# Patient Record
Sex: Female | Born: 1985 | Race: White | Hispanic: No | Marital: Single | Smoking: Current every day smoker
Health system: Southern US, Community
[De-identification: ages and names within clinical notes are randomized; demographics above are authoritative.]

## PROBLEM LIST (undated history)

## (undated) DIAGNOSIS — F32A Depression, unspecified: Secondary | ICD-10-CM

## (undated) DIAGNOSIS — E039 Hypothyroidism, unspecified: Secondary | ICD-10-CM

## (undated) DIAGNOSIS — Q211 Atrial septal defect, unspecified: Secondary | ICD-10-CM

## (undated) DIAGNOSIS — I82409 Acute embolism and thrombosis of unspecified deep veins of unspecified lower extremity: Secondary | ICD-10-CM

## (undated) DIAGNOSIS — Z22322 Carrier or suspected carrier of Methicillin resistant Staphylococcus aureus: Secondary | ICD-10-CM

## (undated) DIAGNOSIS — F909 Attention-deficit hyperactivity disorder, unspecified type: Secondary | ICD-10-CM

## (undated) DIAGNOSIS — J852 Abscess of lung without pneumonia: Secondary | ICD-10-CM

## (undated) DIAGNOSIS — F329 Major depressive disorder, single episode, unspecified: Secondary | ICD-10-CM

## (undated) HISTORY — PX: NASAL SINUS SURGERY: SHX719

---
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
## 2005-04-18 ENCOUNTER — Emergency Department (HOSPITAL_COMMUNITY): Admission: EM | Admit: 2005-04-18 | Discharge: 2005-04-19 | Payer: Self-pay | Admitting: Emergency Medicine

## 2006-07-06 ENCOUNTER — Emergency Department (HOSPITAL_COMMUNITY): Admission: EM | Admit: 2006-07-06 | Discharge: 2006-07-07 | Payer: Self-pay | Admitting: Emergency Medicine

## 2006-07-10 ENCOUNTER — Emergency Department (HOSPITAL_COMMUNITY): Admission: EM | Admit: 2006-07-10 | Discharge: 2006-07-10 | Payer: Self-pay | Admitting: Emergency Medicine

## 2006-10-08 ENCOUNTER — Emergency Department (HOSPITAL_COMMUNITY): Admission: EM | Admit: 2006-10-08 | Discharge: 2006-10-08 | Payer: Self-pay | Admitting: Emergency Medicine

## 2008-01-01 ENCOUNTER — Emergency Department (HOSPITAL_COMMUNITY): Admission: EM | Admit: 2008-01-01 | Discharge: 2008-01-01 | Payer: Self-pay | Admitting: Family Medicine

## 2009-07-27 ENCOUNTER — Emergency Department (HOSPITAL_COMMUNITY): Admission: EM | Admit: 2009-07-27 | Discharge: 2009-07-27 | Payer: Self-pay | Admitting: Emergency Medicine

## 2009-08-07 ENCOUNTER — Inpatient Hospital Stay (HOSPITAL_COMMUNITY): Admission: EM | Admit: 2009-08-07 | Discharge: 2009-08-11 | Payer: Self-pay | Admitting: Emergency Medicine

## 2009-08-07 ENCOUNTER — Ambulatory Visit: Payer: Self-pay | Admitting: Cardiology

## 2009-08-07 ENCOUNTER — Ambulatory Visit: Payer: Self-pay | Admitting: Infectious Diseases

## 2009-08-07 ENCOUNTER — Ambulatory Visit: Payer: Self-pay | Admitting: Pulmonary Disease

## 2009-08-09 ENCOUNTER — Encounter (INDEPENDENT_AMBULATORY_CARE_PROVIDER_SITE_OTHER): Payer: Self-pay | Admitting: Internal Medicine

## 2009-08-11 ENCOUNTER — Ambulatory Visit: Payer: Self-pay | Admitting: Internal Medicine

## 2009-08-11 ENCOUNTER — Encounter: Payer: Self-pay | Admitting: Internal Medicine

## 2009-08-17 ENCOUNTER — Ambulatory Visit: Payer: Self-pay | Admitting: Vascular Surgery

## 2009-08-17 ENCOUNTER — Inpatient Hospital Stay (HOSPITAL_COMMUNITY): Admission: EM | Admit: 2009-08-17 | Discharge: 2009-08-19 | Payer: Self-pay | Admitting: Emergency Medicine

## 2009-08-20 ENCOUNTER — Ambulatory Visit: Payer: Self-pay | Admitting: Internal Medicine

## 2009-08-22 ENCOUNTER — Ambulatory Visit: Payer: Self-pay | Admitting: Internal Medicine

## 2009-08-25 ENCOUNTER — Ambulatory Visit: Payer: Self-pay | Admitting: Internal Medicine

## 2009-08-29 ENCOUNTER — Ambulatory Visit: Payer: Self-pay | Admitting: Internal Medicine

## 2009-09-05 ENCOUNTER — Ambulatory Visit: Payer: Self-pay | Admitting: Internal Medicine

## 2009-09-05 LAB — CONVERTED CEMR LAB
Basophils Absolute: 0 10*3/uL (ref 0.0–0.1)
Eosinophils Absolute: 0.1 10*3/uL (ref 0.0–0.7)
Eosinophils Relative: 2 % (ref 0–5)
HCT: 37.6 % (ref 36.0–46.0)
Hemoglobin: 12.1 g/dL (ref 12.0–15.0)
Iron: 73 ug/dL (ref 42–145)
MCHC: 32.2 g/dL (ref 30.0–36.0)
MCV: 91 fL (ref 78.0–100.0)
Monocytes Absolute: 0.5 10*3/uL (ref 0.1–1.0)
Platelets: 269 10*3/uL (ref 150–400)
RDW: 14.4 % (ref 11.5–15.5)
Retic Ct Pct: 1.8 % (ref 0.4–3.1)

## 2009-09-17 ENCOUNTER — Ambulatory Visit (HOSPITAL_COMMUNITY): Admission: RE | Admit: 2009-09-17 | Discharge: 2009-09-17 | Payer: Self-pay | Admitting: Internal Medicine

## 2009-09-18 ENCOUNTER — Encounter (INDEPENDENT_AMBULATORY_CARE_PROVIDER_SITE_OTHER): Payer: Self-pay | Admitting: Internal Medicine

## 2009-09-25 ENCOUNTER — Ambulatory Visit: Payer: Self-pay | Admitting: Pulmonary Disease

## 2009-09-25 DIAGNOSIS — J852 Abscess of lung without pneumonia: Secondary | ICD-10-CM | POA: Insufficient documentation

## 2009-09-25 DIAGNOSIS — Z9189 Other specified personal risk factors, not elsewhere classified: Secondary | ICD-10-CM | POA: Insufficient documentation

## 2009-09-25 DIAGNOSIS — F329 Major depressive disorder, single episode, unspecified: Secondary | ICD-10-CM

## 2009-10-16 ENCOUNTER — Encounter (INDEPENDENT_AMBULATORY_CARE_PROVIDER_SITE_OTHER): Payer: Self-pay | Admitting: Internal Medicine

## 2009-10-20 ENCOUNTER — Emergency Department (HOSPITAL_COMMUNITY): Admission: EM | Admit: 2009-10-20 | Discharge: 2009-10-20 | Payer: Self-pay | Admitting: Emergency Medicine

## 2009-10-29 ENCOUNTER — Encounter (INDEPENDENT_AMBULATORY_CARE_PROVIDER_SITE_OTHER): Payer: Self-pay | Admitting: Internal Medicine

## 2009-10-29 ENCOUNTER — Ambulatory Visit: Payer: Self-pay | Admitting: Internal Medicine

## 2009-10-29 LAB — CONVERTED CEMR LAB: INR: 2.28 — ABNORMAL HIGH (ref <1.50)

## 2009-11-12 ENCOUNTER — Emergency Department (HOSPITAL_COMMUNITY)
Admission: EM | Admit: 2009-11-12 | Discharge: 2009-11-12 | Payer: Self-pay | Source: Home / Self Care | Admitting: Family Medicine

## 2009-11-14 ENCOUNTER — Inpatient Hospital Stay (HOSPITAL_COMMUNITY)
Admission: EM | Admit: 2009-11-14 | Discharge: 2009-11-17 | Payer: Self-pay | Source: Home / Self Care | Admitting: Emergency Medicine

## 2009-11-14 ENCOUNTER — Encounter (INDEPENDENT_AMBULATORY_CARE_PROVIDER_SITE_OTHER): Payer: Self-pay | Admitting: Internal Medicine

## 2009-11-16 ENCOUNTER — Encounter (INDEPENDENT_AMBULATORY_CARE_PROVIDER_SITE_OTHER): Payer: Self-pay | Admitting: Internal Medicine

## 2009-11-16 ENCOUNTER — Ambulatory Visit: Payer: Self-pay | Admitting: Vascular Surgery

## 2009-11-16 ENCOUNTER — Other Ambulatory Visit: Payer: Self-pay | Admitting: Otolaryngology

## 2009-11-17 ENCOUNTER — Other Ambulatory Visit: Payer: Self-pay | Admitting: Otolaryngology

## 2009-11-17 ENCOUNTER — Other Ambulatory Visit: Payer: Self-pay | Admitting: Internal Medicine

## 2009-11-17 ENCOUNTER — Encounter: Payer: Self-pay | Admitting: Pulmonary Disease

## 2009-11-18 ENCOUNTER — Ambulatory Visit: Payer: Self-pay | Admitting: Cardiovascular Disease

## 2009-11-18 ENCOUNTER — Inpatient Hospital Stay (HOSPITAL_COMMUNITY)
Admission: EM | Admit: 2009-11-18 | Discharge: 2009-11-21 | Payer: Self-pay | Source: Home / Self Care | Admitting: Emergency Medicine

## 2009-11-21 ENCOUNTER — Encounter (INDEPENDENT_AMBULATORY_CARE_PROVIDER_SITE_OTHER): Payer: Self-pay | Admitting: Internal Medicine

## 2009-11-24 ENCOUNTER — Telehealth (INDEPENDENT_AMBULATORY_CARE_PROVIDER_SITE_OTHER): Payer: Self-pay | Admitting: *Deleted

## 2009-11-24 ENCOUNTER — Ambulatory Visit: Payer: Self-pay | Admitting: Pulmonary Disease

## 2009-11-24 DIAGNOSIS — I80299 Phlebitis and thrombophlebitis of other deep vessels of unspecified lower extremity: Secondary | ICD-10-CM

## 2009-11-25 ENCOUNTER — Encounter: Payer: Self-pay | Admitting: Critical Care Medicine

## 2009-11-25 DIAGNOSIS — E039 Hypothyroidism, unspecified: Secondary | ICD-10-CM | POA: Insufficient documentation

## 2009-12-16 ENCOUNTER — Encounter (INDEPENDENT_AMBULATORY_CARE_PROVIDER_SITE_OTHER): Payer: Self-pay | Admitting: *Deleted

## 2009-12-16 LAB — CONVERTED CEMR LAB
T3 Uptake Ratio: 33.3 % (ref 22.5–37.0)
T4, Total: 8.5 ug/dL (ref 5.0–12.5)
TSH: 0.636 microintl units/mL (ref 0.350–4.500)

## 2009-12-19 ENCOUNTER — Encounter (INDEPENDENT_AMBULATORY_CARE_PROVIDER_SITE_OTHER): Payer: Self-pay | Admitting: *Deleted

## 2009-12-19 LAB — CONVERTED CEMR LAB
Alkaline Phosphatase: 34 units/L — ABNORMAL LOW (ref 39–117)
Basophils Relative: 1 % (ref 0–1)
CO2: 25 meq/L (ref 19–32)
Creatinine, Ser: 0.86 mg/dL (ref 0.40–1.20)
Eosinophils Absolute: 0.1 10*3/uL (ref 0.0–0.7)
Eosinophils Relative: 2 % (ref 0–5)
Glucose, Bld: 86 mg/dL (ref 70–99)
HCT: 38.5 % (ref 36.0–46.0)
Lymphs Abs: 2.1 10*3/uL (ref 0.7–4.0)
MCHC: 32.7 g/dL (ref 30.0–36.0)
MCV: 91.9 fL (ref 78.0–100.0)
Monocytes Absolute: 0.5 10*3/uL (ref 0.1–1.0)
Monocytes Relative: 8 % (ref 3–12)
Neutrophils Relative %: 60 % (ref 43–77)
RBC: 4.19 M/uL (ref 3.87–5.11)
Sodium: 140 meq/L (ref 135–145)
Total Bilirubin: 0.6 mg/dL (ref 0.3–1.2)
Total Protein: 7.9 g/dL (ref 6.0–8.3)
WBC: 6.8 10*3/uL (ref 4.0–10.5)

## 2010-02-03 NOTE — Miscellaneous (Signed)
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

## 2010-02-03 NOTE — Assessment & Plan Note (Signed)
Summary: 1 MONTH/ MBW   Visit Type:  Follow-up Primary Ha Shannahan/Referring Georgiann Neider:  n/a  CC:  1 month follow up. Pt c/o weight gain, tearful during pt interview, and states Tonya Figueroa is "tired" of being sick.  History of Present Illness: 25/F, recent adm aug 2011 for RUL lung abscess. 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 8/14 duplex showed lt posterior tibial DVT - put on coumadin.  FU CT chest 8/14 showed significant improvement in abscess cavity,rt hilar Lns,  took augmentin x 28 days. Tonya Figueroa denies cough, fevers. Echo - no vegetation, atrial septal aneurysm.  Tonya Figueroa reports hair fall & tiredness , on coumadin.  Tonya Figueroa reports chronic stiffness in both hands & occasional pain & swelling - wonders about carpal tunnel syndrome.  November 24, 2009 11:31 AM  Adm 11/11- 11/13 for nasal MRSA cellulitis , seen by ENT Then again 11/15-18 for weight gain due o 'fluid overload", BNP 200 - 30 range, old Tonya Figueroa had 'heart failure', echo shows nml LV fn & PFO,on lasix,  TSH 6.1 - started on synthroid 25 micrograms Gained 11 lbs since last visit, tearful due to all the diagnoses terms that have been thrown at her.   Preventive Screening-Counseling & Management  Alcohol-Tobacco     Alcohol drinks/day: 0     Smoking Status: quit     Packs/Day: 0.5     Year Started: 2001     Year Quit: 2011  Current Medications (verified): 1)  Wellbutrin Sr 200 Mg Xr12h-Tab (Bupropion Hcl) .... Take 1 Tablet By Mouth Two Times A Day 2)  Adderall 30 Mg Tabs (Amphetamine-Dextroamphetamine) .... Take 1 Tablet By Mouth Two Times A Day 3)  Multivitamins   Tabs (Multiple Vitamin) .... Take 1 Tablet By Mouth Once A Day 4)  Vitamin C 500 Mg Chew (Ascorbic Acid) .... Take 1 Tablet By Mouth Once A Day 5)  Avelox 400 Mg Tabs (Moxifloxacin Hcl) .... Take 1 Tablet By Mouth Once A Day 6)   Levothyroxine Sodium 25 Mcg Tabs (Levothyroxine Sodium) .... Take 1 Tablet By Mouth Once A Day 7)  Zofran 4 Mg Tabs (Ondansetron Hcl) .... Take 1 Tablet By Mouth Every 6 Hours As Needed 8)  Percocet 5-325 Mg Tabs (Oxycodone-Acetaminophen) .... Take 1 Tablet By Mouth Every 6 Hours As Needed 9)  Senokot 8.6 Mg Tabs (Sennosides) .... Take 1 Tablet By Mouth Two Times A Day As Needed 10)  Doxycycline Hyclate 100 Mg Caps (Doxycycline Hyclate) .... 800mg  Two Times A Day 11)  Lasix 40 Mg Tabs (Furosemide) .... Take 1 Tablet By Mouth Once A Day 12)  Bactroban Nasal 2 % Oint (Mupirocin Calcium) .Marland Kitchen.. 1 Application Right Nasal Three Times A Day  Allergies (verified): 1)  ! Robitussin Maximum Strength (Dextromethorphan Hbr)  Past History:  Past Medical History: Last updated: 09/25/2009 Depression ADHD  Social History: Last updated: 09/25/2009 Marital Status: single Children: no Occupation: Catering manager  Patient states former smoker.  Former Psychologist, counselling user  Review of Systems       The patient complains of dyspnea on exertion.  The patient denies anorexia, fever, weight loss, weight gain, vision loss, decreased hearing, hoarseness, chest pain, syncope, peripheral edema, prolonged cough, headaches, hemoptysis, abdominal pain, melena, hematochezia, severe indigestion/heartburn, hematuria, muscle weakness, suspicious skin lesions, transient blindness, difficulty walking, depression, unusual weight change,  abnormal bleeding, and enlarged lymph nodes.    Vital Signs:  Patient profile:   25 year old female Height:      65 inches Weight:      164.6 pounds BMI:     27.49 O2 Sat:      100 % on Room air Temp:     98.1 degrees F oral Pulse rate:   105 / minute BP sitting:   126 / 80  (right arm) Cuff size:   regular  Vitals Entered By: Zackery Barefoot CMA (November 24, 2009 11:18 AM)  O2 Flow:  Room air CC: 1 month follow up. Pt c/o weight gain, tearful during pt interview, states Tonya Figueroa is  "tired" of being sick Comments Medications reviewed with patient Verified contact number and pharmacy with patient Zackery Barefoot CMA  November 24, 2009 11:19 AM    Physical Exam  Additional Exam:  Gen. Pleasant, well-nourished, in no distress, normal affect ENT - no lesions, no post nasal drip Neck: No JVD, no thyromegaly, no carotid bruits Lungs: no use of accessory muscles, no dullness to percussion, clear without rales or rhonchi  Cardiovascular: Rhythm regular, heart sounds  normal, no murmurs or gallops, no peripheral edema Abdomen: soft and non-tender, no hepatosplenomegaly, BS normal. Musculoskeletal: No deformities, no cyanosis or clubbing      Impression & Recommendations:  Problem # 1:  D V T (ICD-451.19)  resolved on FU duplex dc coumadin The following medications were removed from the medication list:    Coumadin 2 Mg Tabs (Warfarin sodium) .Marland Kitchen... Take 3 tabs by mouth at bedtime  Orders: Est. Patient Level IV (16109)  Problem # 2:  LUNG ABSCESS (ICD-513)  resolved  Orders: Est. Patient Level IV (60454)  Problem # 3:  HYPOTHYROIDISM, BORDERLINE (ICD-244.9)  TSH marginally high T3, T4 not checked. Will have her FU with primary care to further address. Her updated medication list for this problem includes:    Levothyroxine Sodium 25 Mcg Tabs (Levothyroxine sodium) .Marland Kitchen... Take 1 tablet by mouth once a day  Orders: Est. Patient Level IV (09811)  Medications Added to Medication List This Visit: 1)  Avelox 400 Mg Tabs (Moxifloxacin hcl) .... Take 1 tablet by mouth once a day 2)  Levothyroxine Sodium 25 Mcg Tabs (Levothyroxine sodium) .... Take 1 tablet by mouth once a day 3)  Zofran 4 Mg Tabs (Ondansetron hcl) .... Take 1 tablet by mouth every 6 hours as needed 4)  Percocet 5-325 Mg Tabs (Oxycodone-acetaminophen) .... Take 1 tablet by mouth every 6 hours as needed 5)  Senokot 8.6 Mg Tabs (Sennosides) .... Take 1 tablet by mouth two times a day as  needed 6)  Doxycycline Hyclate 100 Mg Caps (Doxycycline hyclate) .... 800mg  two times a day 7)  Lasix 40 Mg Tabs (Furosemide) .... Take 1 tablet by mouth once a day 8)  Bactroban Nasal 2 % Oint (Mupirocin calcium) .Marland Kitchen.. 1 application right nasal three times a day  Patient Instructions: 1)  Copy sent to: Dr Reche Dixon 2)  Please schedule a follow-up appointment as needed. 3)  Stay on thyroid medication 4)  You have patent foramen ovale (PFO ) -opening in your heart 5)  We will find you another medical Dr if Dr Reche Dixon unable to see you   Immunization History:  Influenza Immunization History:    Influenza:  historical (11/20/2009)  Pneumovax Immunization History:    Pneumovax:  historical (11/20/2009)

## 2010-02-03 NOTE — Assessment & Plan Note (Signed)
Summary: POST HOSP/CB   Visit Type:  Hospital Follow-up Primary Provider/Referring Provider:  n/a  CC:  Pt here for hospital follow up. Tonya Figueroa  History of Present Illness: 25/F, recent adm aug 2011 for RUL lung abscess. 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 8/14 duplex showed lt posterior tibial DVT - put on coumadin.  FU CT chest 8/14 showed significant improvement in abscess cavity,rt hilar Lns,  took augmentin x 28 days. She denies cough, fevers. Echo - no vegetation, atrial septal aneurysm.  She reports hair fall & tiredness , on coumadin.  She reports chronic stiffness in both hands & occasional pain & swelling - wonders about carpal tunnel syndrome.   Preventive Screening-Counseling & Management  Alcohol-Tobacco     Alcohol drinks/day: 0     Smoking Status: quit     Packs/Day: 0.5     Year Started: 2001     Year Quit: 2011  Current Medications (verified): 1)  Wellbutrin Sr 200 Mg Xr12h-Tab (Bupropion Hcl) .... Take 1 Tablet By Mouth Two Times A Day 2)  Adderall 30 Mg Tabs (Amphetamine-Dextroamphetamine) .... Take 1 Tablet By Mouth Two Times A Day 3)  Coumadin 2 Mg Tabs (Warfarin Sodium) .... Take 3 Tabs By Mouth At Bedtime 4)  Multivitamins   Tabs (Multiple Vitamin) .... Take 1 Tablet By Mouth Once A Day 5)  Vitamin C 500 Mg Chew (Ascorbic Acid) .... Take 1 Tablet By Mouth Once A Day 6)  Vitamin D .... Take 1 Capsule By Mouth Once A Day  Allergies (verified): 1)  ! Robitussin Maximum Strength (Dextromethorphan Hbr)  Past History:  Past Medical History: Depression ADHD  Past Surgical History: WISDOM TEETH EXTRACTION, HX OF (ICD-V15.9)    Family History: Family History Diabetes-mother Heart problems-mother side Depression-mother  Social History: Marital Status: single Children: no Occupation: Geophysical data processor  Patient states former smoker.  Former Chief Technology Officer Smoking Status:  quit Packs/Day:  0.5 Alcohol drinks/day:  0  Review of Systems  The patient denies shortness of breath with activity, shortness of breath at rest, productive cough, non-productive cough, coughing up blood, chest pain, irregular heartbeats, acid heartburn, indigestion, loss of appetite, weight change, abdominal pain, difficulty swallowing, sore throat, tooth/dental problems, headaches, nasal congestion/difficulty breathing through nose, sneezing, itching, ear ache, anxiety, depression, hand/feet swelling, joint stiffness or pain, rash, change in color of mucus, and fever.    Vital Signs:  Patient profile:   25 year old female Height:      65 inches Weight:      153.50 pounds BMI:     25.64 O2 Sat:      98 % on Room air Temp:     98.1 degrees F oral Pulse rate:   68 / minute BP sitting:   108 / 70  (right arm) Cuff size:   regular  Vitals Entered By: Zackery Barefoot CMA (September 25, 2009 9:40 AM)  O2 Flow:  Room air CC: Pt here for hospital follow up.  Comments Medications reviewed with patient Verified contact number and pharmacy with patient Zackery Barefoot CMA  September 25, 2009 9:40 AM    Physical Exam  Additional Exam:  Gen. Pleasant, well-nourished, in no distress, normal affect ENT - no lesions, no post nasal drip Neck: No JVD, no thyromegaly, no carotid bruits Lungs: no  use of accessory muscles, no dullness to percussion, clear without rales or rhonchi  Cardiovascular: Rhythm regular, heart sounds  normal, no murmurs or gallops, no peripheral edema Abdomen: soft and non-tender, no hepatosplenomegaly, BS normal. Musculoskeletal: No deformities, no cyanosis or clubbing Neuro:  alert, non focal     Impression & Recommendations:  Problem # 1:  LUNG ABSCESS (ICD-513) Almost resolved on CXR 9/14 rpt CXR in 2 months Rt hilar LNS noted , likley infectious - doubt rpt CT required given such  improvement Orders: Est. Patient Level IV (16109) Doppler Referral (Doppler)  Problem # 2:  PULMONARY EMBOLISM (ICD-415.1) Hopefully, 3 months of coumadin would be adequate rpt doppler in 3 mnths Rheum opinion for carpal tunnel/ small joint arthritis Her updated medication list for this problem includes:    Coumadin 2 Mg Tabs (Warfarin sodium) .Tonya Figueroa... Take 3 tabs by mouth at bedtime  Orders: Est. Patient Level IV (60454) Rheumatology Referral (Rheumatology) Doppler Referral (Doppler)  Medications Added to Medication List This Visit: 1)  Wellbutrin Sr 200 Mg Xr12h-tab (Bupropion hcl) .... Take 1 tablet by mouth two times a day 2)  Adderall 30 Mg Tabs (Amphetamine-dextroamphetamine) .... Take 1 tablet by mouth two times a day 3)  Coumadin 2 Mg Tabs (Warfarin sodium) .... Take 3 tabs by mouth at bedtime 4)  Multivitamins Tabs (Multiple vitamin) .... Take 1 tablet by mouth once a day 5)  Vitamin C 500 Mg Chew (Ascorbic acid) .... Take 1 tablet by mouth once a day 6)  Vitamin D  .... Take 1 capsule by mouth once a day  Patient Instructions: 1)  Please schedule a follow-up appointment in 2 months - mid Nov 2)  Repeat doppler study 3)  Copy sent to: [employer]

## 2010-02-03 NOTE — Progress Notes (Signed)
Summary: FAX REQ/ NOTE FOR WORK  Phone Note Call from Patient Call back at [facility] Phone [phone]   Caller: Patient Call For: ALVA Summary of Call: PT WAS SEEN TODAY. NEEDS A NOTE FAXED TO WORK STATING THAT SHE IS CLEARED TO RETURN TO WORK TODAY. FAX ASAP TO THE ATTN OF AMY: [phone] Initial call taken by: Tivis Ringer, CNA,  November 24, 2009 3:03 PM  Follow-up for Phone Call        pls advise if ok for pt to return to work?  Philipp Deputy [facility]  November 24, 2009 4:13 PM   OK. Let her know that I have placed request to get her new PCP at [facility] Follow-up by: Comer Locket. Vassie Loll MD,  November 25, 2009 9:01 AM  Additional Follow-up for Phone Call Additional follow up Details #1::        Letter faxed to Amy's attn at fax number provided above.  Pt aware of this and aware RA has placed request for pt a new PCP at [facility].  She verbalized understanding of this.   Gweneth Dimitri RN  November 25, 2009 9:28 AM

## 2010-02-03 NOTE — Letter (Signed)
Summary: [employer]  [address]   [place], [place] [address]   Phone: [phone]  Fax: [phone]    Today's Date: November 24, 2009  Name of Patient: Tonya Figueroa  The above named patient had a medical visit today.    Please take this into consideration when reviewing the time away from work    Special Instructions:  [ x ] None  [  ] To be off the remainder of today, returning to the normal work / school schedule tomorrow.  [  ] To be off until the next scheduled appointment on ______________________.  [ x ] Other _ may return to work today __________________________________ ________________________________________________________________   Sincerely yours,   Dr. Cyril Mourning

## 2010-02-09 ENCOUNTER — Emergency Department (HOSPITAL_COMMUNITY)
Admission: EM | Admit: 2010-02-09 | Discharge: 2010-02-09 | Disposition: A | Discharge status: Home / Self Care | Payer: Self-pay | Attending: Emergency Medicine | Admitting: Emergency Medicine

## 2010-02-09 ENCOUNTER — Emergency Department (HOSPITAL_COMMUNITY): Payer: Self-pay

## 2010-02-09 DIAGNOSIS — F329 Major depressive disorder, single episode, unspecified: Secondary | ICD-10-CM | POA: Insufficient documentation

## 2010-02-09 DIAGNOSIS — R635 Abnormal weight gain: Secondary | ICD-10-CM | POA: Insufficient documentation

## 2010-02-09 DIAGNOSIS — F411 Generalized anxiety disorder: Secondary | ICD-10-CM | POA: Insufficient documentation

## 2010-02-09 DIAGNOSIS — R5381 Other malaise: Secondary | ICD-10-CM | POA: Insufficient documentation

## 2010-02-09 DIAGNOSIS — F3289 Other specified depressive episodes: Secondary | ICD-10-CM | POA: Insufficient documentation

## 2010-02-09 DIAGNOSIS — E039 Hypothyroidism, unspecified: Secondary | ICD-10-CM | POA: Insufficient documentation

## 2010-02-09 DIAGNOSIS — R0602 Shortness of breath: Secondary | ICD-10-CM | POA: Insufficient documentation

## 2010-02-09 DIAGNOSIS — R002 Palpitations: Secondary | ICD-10-CM | POA: Insufficient documentation

## 2010-02-09 LAB — POCT I-STAT, CHEM 8
BUN: 20 mg/dL (ref 6–23)
Calcium, Ion: 1.2 mmol/L (ref 1.12–1.32)
Creatinine, Ser: 1.1 mg/dL (ref 0.4–1.2)
Glucose, Bld: 89 mg/dL (ref 70–99)
Hemoglobin: 14.3 g/dL (ref 12.0–15.0)
Sodium: 137 mEq/L (ref 135–145)
TCO2: 24 mmol/L (ref 0–100)

## 2010-02-10 LAB — TSH: TSH: 1.47 u[IU]/mL (ref 0.350–4.500)

## 2010-02-19 NOTE — Op Note (Signed)
  Tonya Figueroa, Tonya Figueroa NO.:  [id number]  MEDICAL RECORD NO.:  [id number]          PATIENT TYPE:  INP  LOCATION:  5158                         FACILITY:  [facility]  PHYSICIAN:  Suzanna Obey, M.D.       DATE OF BIRTH:  Feb 27, 1985  DATE OF PROCEDURE:  11/16/2009 DATE OF DISCHARGE:                              OPERATIVE REPORT   PREOPERATIVE DIAGNOSIS:  Right nasal abscess.  POSTOPERATIVE DIAGNOSIS:  Right nasal abscess.  SURGICAL PROCEDURE:  Incision and drainage of right nasal vestibule abscess.  ANESTHESIA:  General.  ESTIMATED BLOOD LOSS:  Less than 5 mL.  INDICATIONS:  This is a 25 year old with a previous history of MRSA on her gluteus region and previous lung abscess and now has a swelling of her right face.  She was admitted, placed on intravenous antibiotics and has settled down, but MRI and CT scan done on consecutive days showed persistence of a fluid-filled right nasal area about 1 cm in size, and she persists with significant discomfort and some swelling.  It has improved on antibiotics, but she wanted to proceed with the incision and drainage.  She was informed of the risks and benefits of the procedure and options were discussed.  All questions were answered and consent was obtained.  OPERATION:  The patient was taken to the operating room and placed in supine position after LMA general anesthesia, she was placed in a supine position, prepped and draped in the usual sterile manner.  The nose was examined.  There was a crusted area, overlying the intercartilaginous region in the vestibule superiorly and this was opened up with a hemostat and a forceps.  This opened the abscess cavity and cultures were taken.  There was white purulent material within the cavity.  A hemostat was used to open up this cavity widely, and the pus was evacuated with the Culturette as well as the saline irrigation.  Adaptic wick was placed into the wound, and there was no  further areas of concern of abscess cavity formation.  She was then awakened and brought to recovery in stable condition.  Counts correct.          ______________________________ Suzanna Obey, M.D.     JB/MEDQ  D:  11/16/2009  T:  11/17/2009  Job:  [id number]  Electronically Signed by Suzanna Obey M.D. on 02/19/2010 09:54:07 AM

## 2010-03-17 LAB — TSH: TSH: 6.105 u[IU]/mL — ABNORMAL HIGH (ref 0.350–4.500)

## 2010-03-17 LAB — BASIC METABOLIC PANEL
BUN: 12 mg/dL (ref 6–23)
BUN: 18 mg/dL (ref 6–23)
BUN: 8 mg/dL (ref 6–23)
Calcium: 9.3 mg/dL (ref 8.4–10.5)
Calcium: 9.4 mg/dL (ref 8.4–10.5)
Chloride: 94 mEq/L — ABNORMAL LOW (ref 96–112)
Creatinine, Ser: 0.76 mg/dL (ref 0.4–1.2)
GFR calc Af Amer: >60 mL/min (ref >60)
GFR calc non Af Amer: >60 mL/min (ref >60)
GFR calc non Af Amer: >60 mL/min (ref >60)
GFR calc non Af Amer: >60 mL/min (ref >60)
Glucose, Bld: 91 mg/dL (ref 70–99)
Glucose, Bld: 96 mg/dL (ref 70–99)
Potassium: 3.5 mEq/L (ref 3.5–5.1)
Sodium: 133 mEq/L — ABNORMAL LOW (ref 135–145)
Sodium: 134 mEq/L — ABNORMAL LOW (ref 135–145)
Sodium: 139 mEq/L (ref 135–145)

## 2010-03-17 LAB — PROTIME-INR
INR: 1.37 (ref 0.00–1.49)
INR: 2.11 — ABNORMAL HIGH (ref 0.00–1.49)
INR: 1.05 (ref 0.00–1.49)
Prothrombin Time: 17.1 seconds — ABNORMAL HIGH (ref 11.6–15.2)
Prothrombin Time: 13.9 seconds (ref 11.6–15.2)

## 2010-03-17 LAB — COMPREHENSIVE METABOLIC PANEL
ALT: 14 U/L (ref 0–35)
ALT: 26 U/L (ref 0–35)
Alkaline Phosphatase: 34 U/L — ABNORMAL LOW (ref 39–117)
BUN: 6 mg/dL (ref 6–23)
BUN: 6 mg/dL (ref 6–23)
CO2: 26 mEq/L (ref 19–32)
Calcium: 9.1 mg/dL (ref 8.4–10.5)
Chloride: 107 mEq/L (ref 96–112)
GFR calc Af Amer: >60 mL/min (ref >60)
GFR calc non Af Amer: >60 mL/min (ref >60)
GFR calc non Af Amer: >60 mL/min (ref >60)
Glucose, Bld: 91 mg/dL (ref 70–99)
Sodium: 140 mEq/L (ref 135–145)
Total Bilirubin: 0.3 mg/dL (ref 0.3–1.2)
Total Protein: 7.2 g/dL (ref 6.0–8.3)

## 2010-03-17 LAB — ANAEROBIC CULTURE

## 2010-03-17 LAB — DIFFERENTIAL
Basophils Absolute: 0 10*3/uL (ref 0.0–0.1)
Basophils Relative: 1 % (ref 0–1)
Basophils Relative: 0 % (ref 0–1)
Eosinophils Absolute: 0 10*3/uL (ref 0.0–0.7)
Eosinophils Absolute: 0.2 10*3/uL (ref 0.0–0.7)
Lymphs Abs: 2 10*3/uL (ref 0.7–4.0)
Monocytes Relative: 8 % (ref 3–12)
Neutro Abs: 8.3 10*3/uL — ABNORMAL HIGH (ref 1.7–7.7)
Neutrophils Relative %: 73 % (ref 43–77)
Neutrophils Relative %: 65 % (ref 43–77)

## 2010-03-17 LAB — URINALYSIS, ROUTINE W REFLEX MICROSCOPIC
Bilirubin Urine: NEGATIVE
Ketones, ur: NEGATIVE mg/dL
Nitrite: NEGATIVE
pH: 6 (ref 5.0–8.0)

## 2010-03-17 LAB — CBC
HCT: 31.2 % — ABNORMAL LOW (ref 36.0–46.0)
HCT: 35.2 % — ABNORMAL LOW (ref 36.0–46.0)
HCT: 37.5 % (ref 36.0–46.0)
Hemoglobin: 10.6 g/dL — ABNORMAL LOW (ref 12.0–15.0)
Hemoglobin: 12.1 g/dL (ref 12.0–15.0)
Hemoglobin: 11.7 g/dL — ABNORMAL LOW (ref 12.0–15.0)
MCH: 30.5 pg (ref 26.0–34.0)
MCH: 30.8 pg (ref 26.0–34.0)
MCH: 29.8 pg (ref 26.0–34.0)
MCHC: 33.9 g/dL (ref 30.0–36.0)
MCHC: 34.4 g/dL (ref 30.0–36.0)
MCHC: 31.8 g/dL (ref 30.0–36.0)
MCV: 89.4 fL (ref 78.0–100.0)
MCV: 91.2 fL (ref 78.0–100.0)
Platelets: 186 10*3/uL (ref 150–400)
RBC: 3.15 MIL/uL — ABNORMAL LOW (ref 3.87–5.11)
RBC: 4.11 MIL/uL (ref 3.87–5.11)
RDW: 13.9 % (ref 11.5–15.5)
RDW: 14.2 % (ref 11.5–15.5)
WBC: 9.2 10*3/uL (ref 4.0–10.5)

## 2010-03-17 LAB — CULTURE, ROUTINE-ABSCESS

## 2010-03-17 LAB — TYPE AND SCREEN: Antibody Screen: NEGATIVE

## 2010-03-17 LAB — URINE MICROSCOPIC-ADD ON

## 2010-03-17 LAB — POCT PREGNANCY, URINE: Preg Test, Ur: NEGATIVE

## 2010-03-17 LAB — TROPONIN I: Troponin I: 0.04 ng/mL (ref 0.00–0.06)

## 2010-03-17 LAB — CULTURE, BLOOD (ROUTINE X 2)
Culture  Setup Time: 201111111802
Culture: NO GROWTH

## 2010-03-17 LAB — LIPASE, BLOOD: Lipase: 15 U/L (ref 11–59)

## 2010-03-17 LAB — ABO/RH: ABO/RH(D): A POS

## 2010-03-18 LAB — POCT URINALYSIS DIPSTICK
Bilirubin Urine: NEGATIVE
Glucose, UA: NEGATIVE mg/dL
Nitrite: POSITIVE — AB
Protein, ur: NEGATIVE mg/dL
Specific Gravity, Urine: >=1.03 (ref 1.005–1.030)
Urobilinogen, UA: 0.2 mg/dL (ref 0.0–1.0)
pH: 5.5 (ref 5.0–8.0)

## 2010-03-18 LAB — CULTURE, ROUTINE-ABSCESS

## 2010-03-20 LAB — DIFFERENTIAL
Basophils Absolute: 0 10*3/uL (ref 0.0–0.1)
Basophils Absolute: 0 10*3/uL (ref 0.0–0.1)
Basophils Absolute: 0.1 10*3/uL (ref 0.0–0.1)
Basophils Relative: 0 % (ref 0–1)
Basophils Relative: 1 % (ref 0–1)
Eosinophils Absolute: 0 10*3/uL (ref 0.0–0.7)
Eosinophils Absolute: 0.2 10*3/uL (ref 0.0–0.7)
Eosinophils Relative: 0 % (ref 0–5)
Eosinophils Relative: 0 % (ref 0–5)
Eosinophils Relative: 1 % (ref 0–5)
Lymphocytes Relative: 6 % — ABNORMAL LOW (ref 12–46)
Lymphs Abs: 1.1 10*3/uL (ref 0.7–4.0)
Lymphs Abs: 1.7 10*3/uL (ref 0.7–4.0)
Monocytes Absolute: 0.7 10*3/uL (ref 0.1–1.0)
Monocytes Absolute: 1.3 10*3/uL — ABNORMAL HIGH (ref 0.1–1.0)
Monocytes Relative: 5 % (ref 3–12)
Monocytes Relative: 7 % (ref 3–12)
Neutro Abs: 15.2 10*3/uL — ABNORMAL HIGH (ref 1.7–7.7)

## 2010-03-20 LAB — URINALYSIS, ROUTINE W REFLEX MICROSCOPIC
Glucose, UA: NEGATIVE mg/dL
Hgb urine dipstick: NEGATIVE
Ketones, ur: NEGATIVE mg/dL
Ketones, ur: NEGATIVE mg/dL
Nitrite: NEGATIVE
Protein, ur: NEGATIVE mg/dL
Protein, ur: NEGATIVE mg/dL
Urobilinogen, UA: 0.2 mg/dL (ref 0.0–1.0)

## 2010-03-20 LAB — CULTURE, RESPIRATORY W GRAM STAIN

## 2010-03-20 LAB — CULTURE, BLOOD (ROUTINE X 2)
Culture: NO GROWTH
Culture: NO GROWTH

## 2010-03-20 LAB — CBC
HCT: 35.2 % — ABNORMAL LOW (ref 36.0–46.0)
Hemoglobin: 10.9 g/dL — ABNORMAL LOW (ref 12.0–15.0)
Hemoglobin: 12.2 g/dL (ref 12.0–15.0)
MCH: 31.5 pg (ref 26.0–34.0)
MCH: 31.6 pg (ref 26.0–34.0)
MCH: 31.6 pg (ref 26.0–34.0)
MCH: 31.8 pg (ref 26.0–34.0)
MCHC: 34.5 g/dL (ref 30.0–36.0)
MCHC: 34.7 g/dL (ref 30.0–36.0)
MCHC: 34.8 g/dL (ref 30.0–36.0)
MCV: 89.8 fL (ref 78.0–100.0)
MCV: 91.1 fL (ref 78.0–100.0)
Platelets: 310 10*3/uL (ref 150–400)
Platelets: 337 10*3/uL (ref 150–400)
Platelets: 357 10*3/uL (ref 150–400)
Platelets: 412 10*3/uL — ABNORMAL HIGH (ref 150–400)
RBC: 3.79 MIL/uL — ABNORMAL LOW (ref 3.87–5.11)
RDW: 12.9 % (ref 11.5–15.5)
RDW: 13.2 % (ref 11.5–15.5)

## 2010-03-20 LAB — POCT I-STAT, CHEM 8
Calcium, Ion: 1.17 mmol/L (ref 1.12–1.32)
Creatinine, Ser: 0.7 mg/dL (ref 0.4–1.2)
Glucose, Bld: 56 mg/dL — ABNORMAL LOW (ref 70–99)
HCT: 40 % (ref 36.0–46.0)
Hemoglobin: 13.6 g/dL (ref 12.0–15.0)
TCO2: 29 mmol/L (ref 0–100)

## 2010-03-20 LAB — URINE CULTURE
Culture: NO GROWTH
Special Requests: NEGATIVE

## 2010-03-20 LAB — EXPECTORATED SPUTUM ASSESSMENT W GRAM STAIN, RFLX TO RESP C

## 2010-03-20 LAB — LUPUS ANTICOAGULANT PANEL
DRVVT: 61.1 secs — ABNORMAL HIGH (ref 36.2–44.3)
Lupus Anticoagulant: NOT DETECTED
dRVVT Incubated 1:1 Mix: 44.3 secs (ref 36.2–44.3)

## 2010-03-20 LAB — HEPATITIS B DNA, ULTRAQUANTITATIVE, PCR

## 2010-03-20 LAB — CARDIOLIPIN ANTIBODIES, IGG, IGM, IGA
Anticardiolipin IgA: 6 APL U/mL — ABNORMAL LOW (ref <22)
Anticardiolipin IgG: 14 GPL U/mL (ref <23)
Anticardiolipin IgM: 6 MPL U/mL — ABNORMAL LOW (ref <11)

## 2010-03-20 LAB — BASIC METABOLIC PANEL
BUN: 11 mg/dL (ref 6–23)
BUN: 4 mg/dL — ABNORMAL LOW (ref 6–23)
CO2: 26 mEq/L (ref 19–32)
CO2: 28 mEq/L (ref 19–32)
Calcium: 8.5 mg/dL (ref 8.4–10.5)
Chloride: 102 mEq/L (ref 96–112)
Creatinine, Ser: 1.1 mg/dL (ref 0.4–1.2)
GFR calc Af Amer: >60 mL/min (ref >60)
GFR calc non Af Amer: >60 mL/min (ref >60)
Glucose, Bld: 121 mg/dL — ABNORMAL HIGH (ref 70–99)
Potassium: 4.5 mEq/L (ref 3.5–5.1)
Sodium: 138 mEq/L (ref 135–145)

## 2010-03-20 LAB — POCT CARDIAC MARKERS

## 2010-03-20 LAB — HOMOCYSTEINE: Homocysteine: 9.9 umol/L (ref 4.0–15.4)

## 2010-03-20 LAB — BETA-2-GLYCOPROTEIN I ABS, IGG/M/A: Beta-2 Glyco I IgG: 1 G Units (ref <20)

## 2010-03-20 LAB — VANCOMYCIN, TROUGH: Vancomycin Tr: 32.5 ug/mL (ref 10.0–20.0)

## 2010-03-20 LAB — PROTIME-INR
Prothrombin Time: 14.1 seconds (ref 11.6–15.2)
Prothrombin Time: 14.8 seconds (ref 11.6–15.2)

## 2010-03-20 LAB — ANTITHROMBIN III: AntiThromb III Func: 134 % — ABNORMAL HIGH (ref 76–126)

## 2010-03-20 LAB — HEPATITIS B SURFACE ANTIGEN: Hepatitis B Surface Ag: NEGATIVE

## 2010-03-20 LAB — HEPATITIS B SURFACE ANTIBODY,QUALITATIVE: Hep B S Ab: POSITIVE — AB

## 2010-03-20 LAB — AFB CULTURE WITH SMEAR (NOT AT ARMC)

## 2010-03-20 LAB — HEPATITIS B CORE ANTIBODY, TOTAL: Hep B Core Total Ab: POSITIVE — AB

## 2010-03-20 LAB — POCT PREGNANCY, URINE: Preg Test, Ur: NEGATIVE

## 2010-04-02 ENCOUNTER — Emergency Department (HOSPITAL_COMMUNITY): Payer: Self-pay

## 2010-04-02 ENCOUNTER — Emergency Department (HOSPITAL_COMMUNITY)
Admission: EM | Admit: 2010-04-02 | Discharge: 2010-04-02 | Disposition: A | Discharge status: Home / Self Care | Payer: Self-pay | Attending: Emergency Medicine | Admitting: Emergency Medicine

## 2010-04-02 DIAGNOSIS — O99891 Other specified diseases and conditions complicating pregnancy: Secondary | ICD-10-CM | POA: Insufficient documentation

## 2010-04-02 DIAGNOSIS — S239XXA Sprain of unspecified parts of thorax, initial encounter: Secondary | ICD-10-CM | POA: Insufficient documentation

## 2010-04-02 DIAGNOSIS — F3289 Other specified depressive episodes: Secondary | ICD-10-CM | POA: Insufficient documentation

## 2010-04-02 DIAGNOSIS — F909 Attention-deficit hyperactivity disorder, unspecified type: Secondary | ICD-10-CM | POA: Insufficient documentation

## 2010-04-02 DIAGNOSIS — F329 Major depressive disorder, single episode, unspecified: Secondary | ICD-10-CM | POA: Insufficient documentation

## 2010-04-02 DIAGNOSIS — M549 Dorsalgia, unspecified: Secondary | ICD-10-CM | POA: Insufficient documentation

## 2010-04-02 DIAGNOSIS — X58XXXA Exposure to other specified factors, initial encounter: Secondary | ICD-10-CM | POA: Insufficient documentation

## 2010-04-02 DIAGNOSIS — E039 Hypothyroidism, unspecified: Secondary | ICD-10-CM | POA: Insufficient documentation

## 2010-04-02 LAB — URINALYSIS, ROUTINE W REFLEX MICROSCOPIC
Bilirubin Urine: NEGATIVE
Glucose, UA: NEGATIVE mg/dL
Hgb urine dipstick: NEGATIVE
Ketones, ur: NEGATIVE mg/dL
Protein, ur: NEGATIVE mg/dL
pH: 6 (ref 5.0–8.0)

## 2010-04-02 LAB — POCT PREGNANCY, URINE: Preg Test, Ur: POSITIVE

## 2010-04-20 ENCOUNTER — Emergency Department (HOSPITAL_COMMUNITY): Payer: Self-pay

## 2010-04-20 ENCOUNTER — Emergency Department (HOSPITAL_COMMUNITY)
Admission: EM | Admit: 2010-04-20 | Discharge: 2010-04-21 | Disposition: A | Discharge status: Home / Self Care | Payer: Self-pay | Attending: Emergency Medicine | Admitting: Emergency Medicine

## 2010-04-20 DIAGNOSIS — E039 Hypothyroidism, unspecified: Secondary | ICD-10-CM | POA: Insufficient documentation

## 2010-04-20 DIAGNOSIS — O209 Hemorrhage in early pregnancy, unspecified: Secondary | ICD-10-CM | POA: Insufficient documentation

## 2010-04-20 LAB — CBC
HCT: 36.3 % (ref 36.0–46.0)
Hemoglobin: 12.1 g/dL (ref 12.0–15.0)
MCHC: 33.3 g/dL (ref 30.0–36.0)
MCV: 88.1 fL (ref 78.0–100.0)

## 2010-04-20 LAB — URINALYSIS, ROUTINE W REFLEX MICROSCOPIC
Glucose, UA: NEGATIVE mg/dL
Ketones, ur: NEGATIVE mg/dL
Leukocytes, UA: NEGATIVE
pH: 6.5 (ref 5.0–8.0)

## 2010-04-20 LAB — DIFFERENTIAL
Basophils Absolute: 0 10*3/uL (ref 0.0–0.1)
Lymphocytes Relative: 33 % (ref 12–46)
Lymphs Abs: 3.2 10*3/uL (ref 0.7–4.0)
Monocytes Absolute: 0.7 10*3/uL (ref 0.1–1.0)
Monocytes Relative: 7 % (ref 3–12)
Neutro Abs: 5.5 10*3/uL (ref 1.7–7.7)

## 2010-04-20 LAB — COMPREHENSIVE METABOLIC PANEL
AST: 22 U/L (ref 0–37)
Albumin: 3.8 g/dL (ref 3.5–5.2)
Calcium: 9.3 mg/dL (ref 8.4–10.5)
Creatinine, Ser: 0.58 mg/dL (ref 0.4–1.2)
GFR calc Af Amer: >60 mL/min (ref >60)

## 2010-04-20 LAB — WET PREP, GENITAL
Clue Cells Wet Prep HPF POC: NONE SEEN
Trich, Wet Prep: NONE SEEN

## 2010-04-20 LAB — URINE MICROSCOPIC-ADD ON

## 2010-04-21 ENCOUNTER — Other Ambulatory Visit (HOSPITAL_COMMUNITY): Payer: Self-pay | Admitting: Obstetrics

## 2010-04-21 DIAGNOSIS — O3680X0 Pregnancy with inconclusive fetal viability, not applicable or unspecified: Secondary | ICD-10-CM

## 2010-04-21 LAB — GC/CHLAMYDIA PROBE AMP, GENITAL
Chlamydia, DNA Probe: NEGATIVE
GC Probe Amp, Genital: NEGATIVE

## 2010-04-24 ENCOUNTER — Inpatient Hospital Stay (HOSPITAL_COMMUNITY): Payer: Self-pay

## 2010-04-24 ENCOUNTER — Other Ambulatory Visit: Payer: Self-pay | Admitting: Obstetrics

## 2010-04-24 ENCOUNTER — Inpatient Hospital Stay (HOSPITAL_COMMUNITY)
Admission: AD | Admit: 2010-04-24 | Discharge: 2010-04-24 | Disposition: A | Discharge status: Home / Self Care | Payer: Self-pay | Source: Ambulatory Visit | Attending: Obstetrics | Admitting: Obstetrics

## 2010-04-24 DIAGNOSIS — O039 Complete or unspecified spontaneous abortion without complication: Secondary | ICD-10-CM | POA: Insufficient documentation

## 2010-04-24 LAB — CBC
HCT: 38.8 % (ref 36.0–46.0)
MCV: 90 fL (ref 78.0–100.0)
Platelets: 238 10*3/uL (ref 150–400)
RBC: 4.31 MIL/uL (ref 3.87–5.11)
WBC: 9.7 10*3/uL (ref 4.0–10.5)

## 2010-04-27 ENCOUNTER — Other Ambulatory Visit (HOSPITAL_COMMUNITY): Payer: Self-pay | Admitting: Obstetrics

## 2010-04-27 ENCOUNTER — Ambulatory Visit (HOSPITAL_COMMUNITY): Payer: Self-pay

## 2010-04-28 ENCOUNTER — Ambulatory Visit (HOSPITAL_COMMUNITY): Payer: Medicaid Other | Attending: Obstetrics

## 2010-07-06 ENCOUNTER — Emergency Department (HOSPITAL_COMMUNITY)
Admission: EM | Admit: 2010-07-06 | Discharge: 2010-07-06 | Discharge status: Against Medical Advice | Payer: Medicaid Other | Source: Home / Self Care | Attending: Emergency Medicine | Admitting: Emergency Medicine

## 2010-07-07 ENCOUNTER — Inpatient Hospital Stay (INDEPENDENT_AMBULATORY_CARE_PROVIDER_SITE_OTHER)
Admission: RE | Admit: 2010-07-07 | Discharge: 2010-07-07 | Disposition: A | Discharge status: Home / Self Care | Payer: Self-pay | Source: Ambulatory Visit | Attending: Family Medicine | Admitting: Family Medicine

## 2010-07-07 ENCOUNTER — Inpatient Hospital Stay (HOSPITAL_COMMUNITY): Payer: Medicaid Other

## 2010-07-07 ENCOUNTER — Inpatient Hospital Stay (HOSPITAL_COMMUNITY)
Admit: 2010-07-07 | Discharge: 2010-07-09 | DRG: 760 | Disposition: A | Discharge status: Home / Self Care | Payer: Medicaid Other | Source: Ambulatory Visit | Attending: Obstetrics and Gynecology | Admitting: Obstetrics and Gynecology

## 2010-07-07 DIAGNOSIS — R110 Nausea: Secondary | ICD-10-CM

## 2010-07-07 DIAGNOSIS — R52 Pain, unspecified: Secondary | ICD-10-CM

## 2010-07-07 DIAGNOSIS — N920 Excessive and frequent menstruation with regular cycle: Secondary | ICD-10-CM

## 2010-07-07 LAB — POCT URINALYSIS DIP (DEVICE)
Ketones, ur: 15 mg/dL — AB
Urobilinogen, UA: 1 mg/dL (ref 0.0–1.0)
pH: 8.5 — ABNORMAL HIGH (ref 5.0–8.0)

## 2010-07-07 LAB — POCT I-STAT, CHEM 8
BUN: 6 mg/dL (ref 6–23)
Calcium, Ion: 1.22 mmol/L (ref 1.12–1.32)
Chloride: 104 mEq/L (ref 96–112)
HCT: 41 % (ref 36.0–46.0)
Hemoglobin: 13.9 g/dL (ref 12.0–15.0)

## 2010-07-07 LAB — HEMOGLOBIN AND HEMATOCRIT, BLOOD
HCT: 35.9 % — ABNORMAL LOW (ref 36.0–46.0)
Hemoglobin: 11.7 g/dL — ABNORMAL LOW (ref 12.0–15.0)

## 2010-07-07 LAB — CBC
Hemoglobin: 11.9 g/dL — ABNORMAL LOW (ref 12.0–15.0)
RBC: 3.83 MIL/uL — ABNORMAL LOW (ref 3.87–5.11)
WBC: 10.4 10*3/uL (ref 4.0–10.5)

## 2010-07-07 LAB — POCT PREGNANCY, URINE: Preg Test, Ur: NEGATIVE

## 2010-07-08 DIAGNOSIS — Q211 Atrial septal defect: Secondary | ICD-10-CM

## 2010-07-08 DIAGNOSIS — N920 Excessive and frequent menstruation with regular cycle: Secondary | ICD-10-CM

## 2010-07-08 DIAGNOSIS — Q278 Other specified congenital malformations of peripheral vascular system: Secondary | ICD-10-CM

## 2010-07-08 DIAGNOSIS — N949 Unspecified condition associated with female genital organs and menstrual cycle: Secondary | ICD-10-CM

## 2010-07-08 LAB — MRSA PCR SCREENING: MRSA by PCR: NEGATIVE

## 2010-07-08 LAB — GC/CHLAMYDIA PROBE AMP, GENITAL: Chlamydia, DNA Probe: NEGATIVE

## 2010-07-08 LAB — CBC
Platelets: 177 10*3/uL (ref 150–400)
RDW: 13.4 % (ref 11.5–15.5)
WBC: 9.3 10*3/uL (ref 4.0–10.5)

## 2010-07-08 NOTE — H&P (Signed)
NAMEAVILYN, VIRTUE NO.:  [id number]  MEDICAL RECORD NO.:  [id number]  LOCATION:  9307                          FACILITY:  [facility]  PHYSICIAN:  Lazaro Arms, M.D.   DATE OF BIRTH:  02/18/1985  DATE OF ADMISSION:  07/07/2010 DATE OF DISCHARGE:                             HISTORY & PHYSICAL   HISTORY OF PRESENT ILLNESS:  Johari is a 25 year old white female gravida 1, para 0, abortus 1 who originally presented to [facility] ER with a 4-day history of having episodic incredibly heavy vaginal bleeding.  She says she awoke 4 days ago and had blood completely all over her and all over the sheet, she then had no further bleeding for two more days, was walking and suddenly began gushing blood and states her normal period is light, but this was extremely heavy with clots and crampy and changes super pads 3-4 times an hour.  She was working today and shortly after getting off work, she began again having incredibly heavy vaginal bleeding like she has never seen before.  She presented to the ER and was sent down to [facility] and had an ultrasound which showed an unusual area on the posterior wall of the myometrium and endometrium and subsequently ordered an HCG which was negative and did a Doppler evaluation of these vessels which showed this to be an arteriovenous malformation of the posterior myometrium coursing onto the region of the fundus and into the endometrial canal.  Clearly it appears to be a one artery in and a one vein out arteriovenous malformation according to Dr. Kyung Rudd.  The patient continued to have heavy bleeding in the hospital but then it suddenly stopped and her cramping also got better.  Interestingly, she had a miscarriage back in April with an early pregnancy in the first trimester and had two subsequent normal periods.  She has never ever had anything like this before with the finding of probable myometrial/endometrial arteriovenous  malformation actively bleeding even episodically, I was concerned about sending the patient  home.  Her hemoglobin had dropped from 13.9 down to 11.5 and the patient was really fearful of going home, as a result I thought the best way to manage this, I talked with radiologist at length and he feels a magnetic resonance imaging of the pelvis along with the magnetic resonance angiogram is indicated for better delineation of the abnormality probably followed by which we told specifically with interventional radiologist who says that indeed that is the case an embolization of these vessels would be the most appropriate management. I told him even if I did a cauterization that would only help the surface vessels and __________ arteriovenous malformation blood supplies the whole and they agreed, but I did say I did think I would take her to the OR and place a Foley catheter for tamponading.  PAST MEDICAL HISTORY:  The patient unfortunately had according to her had done IV heroin 1 time last August which led to a cavitating lung abscess which caused domino effect of deep vein thrombosis, and led to other medical problems including MRSA, nasal sinusitis which causes her to have nasal surgery.  She was found to have a patent foramen  ovale. She was found be hypothyroid, has attention deficit disorder, and underwent what appears to be iatrogenic failure possibly with all the other issue she had going on and she was on Lovenox and Coumadin and came off the Coumadin in January.  PAST SURGICAL HISTORY:  Only for the sinus surgery.  She does not do any current drugs.  She did drugs when she was in her late teens and really had done anything except marijuana until she did IV heroin one time.  She does currently smoke in the last 12 months. Drinks occasionally.  She has done nothing for contraception.  ALLERGIES:  A really none.  She is sensitive to MORPHINE and HYDROCODONE with nausea and  vomiting.  PHYSICAL EXAMINATION:  HEENT:  Unremarkable. NECK:  Thyroid is normal. LUNGS:  Clear. HEART:  Regular rhythm without murmur, rubs, or gallop. BREASTS:  Deferred. ABDOMEN:  Benign. PELVIC:  Reveals blood in the vault and normal size uterus and adnexa. EXTREMITIES:  No edema. NEUROLOGICAL:  Grossly intact.  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  PLAN:  I am admitting the patient and placing a uterine myometrial tamponade, use a 60 mL silastic catheter.  I am going to schedule the patient for magnetic resonance imaging and magnetic resonance arterial angiography of the pelvis for tomorrow and Interventional Radiology is going to evaluate and see if this is appropriate for embolization of the AVM.  If the findings are indeed confirmed that will be their management plan.  The patient's history of deep venous thrombosis certainly cannot put her on an anticoagulant but I am going to put her on sequential compression devices.  She does appear to have a bladder infection, I am going to give her Ancef preoperatively and I will put her on Levaquin which also should cover her with the silastic balloon being in her endometrial cavity for prolonged period of time, hopefully this will prevent her from getting endometritis.  The patient understands the management plan and will proceed.     Lazaro Arms, M.D.     Loraine Maple  D:  07/07/2010  T:  07/08/2010  Job:  [id number]  Electronically Signed by Duane Lope M.D. on 07/08/2010 04:45:22 PM

## 2010-07-08 NOTE — Op Note (Signed)
  NAMETIMMYA, BLAZIER NO.:  [id number]  MEDICAL RECORD NO.:  [id number]  LOCATION:  9307                          FACILITY:  [facility]  PHYSICIAN:  Lazaro Arms, M.D.   DATE OF BIRTH:  May 27, 1985  DATE OF PROCEDURE:  07/07/2010 DATE OF DISCHARGE:                              OPERATIVE REPORT   PREOPERATIVE DIAGNOSIS:  Myometrial/endometrial arteriovenous malformation with extremely heavy bleeding.  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  PROCEDURE:  Placement of a endometrial Silastic catheter for purposes of tamponade and hemostasis.  SURGEON:  Lazaro Arms, MD  ANESTHESIA:  MAC with paracervical block placed by me.  FINDINGS:  The patient came with episodic very heavy vaginal bleeding and workup has revealed an arterial venous malformation of the posterior myometrium extending up to the fundus and encroaching in the endometrium as well.  As a result management plan includes magnetic resonance imaging of the pelvis and probable evaluation for embolization but in the meantime I want to place catheter for tamponade and bleeding.  The patient at the time of surgery is really not having any active bleeding. It was earlier but at not at the time of surgery.  DESCRIPTION OF OPERATION:  The patient was taken to the operating room, underwent IV sedation with mask, placed a paracervical block.  I placed a paracervical block.  The patient was placed in dorsal lithotomy position and prepped.  Paracervical block was placed without difficulty. A 0.5% Marcaine plain, then dilated the cervix serially to allow passage of the 60 mL balloon Silastic catheter.  I then placed 17 mL of saline in the balloon which gave a good pressure against the endometrial cavity and also with some pressure did not come out of the cervix.  The patient did not suffer any blood loss during the procedure.  She was  awakened from anesthesia, taken to recovery room in good stable condition.  All counts correct x3.  She received Ancef prophylactically.     Lazaro Arms, M.D.     Loraine Maple  D:  07/07/2010  T:  07/08/2010  Job:  [id number]  Electronically Signed by Duane Lope M.D. on 07/08/2010 04:45:26 PM

## 2010-07-09 ENCOUNTER — Inpatient Hospital Stay (HOSPITAL_COMMUNITY): Payer: Medicaid Other

## 2010-07-09 ENCOUNTER — Ambulatory Visit (HOSPITAL_COMMUNITY): Payer: Medicaid Other

## 2010-07-09 ENCOUNTER — Other Ambulatory Visit (HOSPITAL_COMMUNITY): Payer: Medicaid Other

## 2010-07-09 DIAGNOSIS — E039 Hypothyroidism, unspecified: Secondary | ICD-10-CM | POA: Diagnosis present

## 2010-07-09 DIAGNOSIS — N3090 Cystitis, unspecified without hematuria: Secondary | ICD-10-CM | POA: Diagnosis present

## 2010-07-09 DIAGNOSIS — Z86718 Personal history of other venous thrombosis and embolism: Secondary | ICD-10-CM

## 2010-07-09 DIAGNOSIS — Q278 Other specified congenital malformations of peripheral vascular system: Secondary | ICD-10-CM

## 2010-07-09 DIAGNOSIS — Q2111 Secundum atrial septal defect: Secondary | ICD-10-CM

## 2010-07-09 DIAGNOSIS — N920 Excessive and frequent menstruation with regular cycle: Secondary | ICD-10-CM | POA: Diagnosis present

## 2010-07-09 DIAGNOSIS — F988 Other specified behavioral and emotional disorders with onset usually occurring in childhood and adolescence: Secondary | ICD-10-CM | POA: Diagnosis present

## 2010-07-09 DIAGNOSIS — Z8614 Personal history of Methicillin resistant Staphylococcus aureus infection: Secondary | ICD-10-CM

## 2010-07-09 DIAGNOSIS — Q211 Atrial septal defect: Secondary | ICD-10-CM

## 2010-07-09 DIAGNOSIS — F17200 Nicotine dependence, unspecified, uncomplicated: Secondary | ICD-10-CM | POA: Diagnosis present

## 2010-07-09 DIAGNOSIS — N949 Unspecified condition associated with female genital organs and menstrual cycle: Principal | ICD-10-CM | POA: Diagnosis present

## 2010-07-09 DIAGNOSIS — N938 Other specified abnormal uterine and vaginal bleeding: Principal | ICD-10-CM | POA: Diagnosis present

## 2010-07-09 LAB — CBC
MCHC: 32.7 g/dL (ref 30.0–36.0)
MCV: 94.3 fL (ref 78.0–100.0)
Platelets: 180 10*3/uL (ref 150–400)
RDW: 13.4 % (ref 11.5–15.5)
WBC: 7.8 10*3/uL (ref 4.0–10.5)

## 2010-07-09 LAB — TYPE AND SCREEN: Antibody Screen: NEGATIVE

## 2010-07-09 MED ORDER — GADOBENATE DIMEGLUMINE 529 MG/ML IV SOLN
15.0000 mL | Freq: Once | INTRAVENOUS | Status: AC | PRN
  Administered 2010-07-09: 15 mL via INTRAVENOUS

## 2010-07-11 NOTE — Discharge Summary (Signed)
[name], [name]:  [id number]  MEDICAL RECORD NO.:  [id number]  LOCATION:  9307                          FACILITY:  [facility]  PHYSICIAN:  Catalina Antigua, MD     DATE OF BIRTH:  02-04-1985  DATE OF ADMISSION:  07/07/2010 DATE OF DISCHARGE:  07/10/2010                              DISCHARGE SUMMARY   ADMISSION DIAGNOSIS:  Menorrhagia.  DISCHARGE DIAGNOSIS:  Resolution of menorrhagia.  HOSPITAL COURSE:  This is a 25 year old G2, P0-0-2-0, who is status post spontaneous AB in April 2012, who presented to the MAU with a complaint of heavy vaginal bleeding, changing pads every 3-4 hours.  The patient was first seen at [facility], was found to have a hemoglobin of 13 and sent to [facility] for further evaluation.  On admission, her hemoglobin was 11.  She had a small amount of bleeding from the external os.  Vital signs were stable.  She was afebrile and not tachycardic and not hypotensive.  Ultrasound was performed which revealed a normal uterus with possibility of an arteriovenous formation. In view of the ultrasound result as well as the patient's vaginal bleeding, decision was made to have a Foley balloon placed intraoperatively in the uterine cavity just to tamponade any abnormal vessels.  A catheter was then introduced in to the endometrial cavity and inflated to 17 mL.  An MRI and MRA was subsequently ordered to confirm the diagnosis of AV malformation.  However, the results of the MRI was completely normal.  On hospital day #2, the intrauterine Foley catheter was slowly deflated and the vaginal bleeding was observed.  The patient only changed 1 pad throughout the day.  The Foley catheter was then completely removed and again the vaginal bleeding was further observed, minimal bleeding was appreciated.  The patient was able to ambulate without difficulty or without symptoms of lightheadedness, dizziness, or generalized weakness.   She was able to tolerate a regular meal.  The patient was deemed stable for discharge.  The patient is to follow up in our clinic in approximately 1 month for followup, and the patient was instructed to return to the MAU if she develops vaginal hemorrhage again or severe abdominal pain.  The patient verbalized understanding.     Catalina Antigua, MD     PC/MEDQ  D:  07/10/2010  T:  07/11/2010  Job:  [id number]

## 2010-08-05 MED ORDER — GADOBENATE DIMEGLUMINE 529 MG/ML IV SOLN
15.0000 mL | Freq: Once | INTRAVENOUS | Status: AC | PRN
  Administered 2010-08-05: 15 mL via INTRAVENOUS

## 2010-10-09 LAB — POCT URINALYSIS DIP (DEVICE)
Ketones, ur: 15 mg/dL — AB
Protein, ur: 30 mg/dL — AB
Specific Gravity, Urine: 1.02 (ref 1.005–1.030)
pH: 7 (ref 5.0–8.0)

## 2010-10-20 LAB — URINALYSIS, ROUTINE W REFLEX MICROSCOPIC
Glucose, UA: NEGATIVE
Specific Gravity, Urine: 1.021

## 2010-10-20 LAB — URINE MICROSCOPIC-ADD ON

## 2011-02-25 ENCOUNTER — Emergency Department (INDEPENDENT_AMBULATORY_CARE_PROVIDER_SITE_OTHER): Payer: Self-pay

## 2011-02-25 ENCOUNTER — Emergency Department (INDEPENDENT_AMBULATORY_CARE_PROVIDER_SITE_OTHER)
Admission: EM | Admit: 2011-02-25 | Discharge: 2011-02-25 | Disposition: A | Discharge status: Home / Self Care | Payer: Self-pay | Source: Home / Self Care | Attending: Emergency Medicine | Admitting: Emergency Medicine

## 2011-02-25 ENCOUNTER — Encounter (HOSPITAL_COMMUNITY): Payer: Self-pay | Admitting: *Deleted

## 2011-02-25 DIAGNOSIS — R0989 Other specified symptoms and signs involving the circulatory and respiratory systems: Secondary | ICD-10-CM

## 2011-02-25 DIAGNOSIS — R6889 Other general symptoms and signs: Secondary | ICD-10-CM

## 2011-02-25 HISTORY — DX: Carrier or suspected carrier of methicillin resistant Staphylococcus aureus: Z22.322

## 2011-02-25 HISTORY — DX: Hypothyroidism, unspecified: E03.9

## 2011-02-25 HISTORY — DX: Acute embolism and thrombosis of unspecified deep veins of unspecified lower extremity: I82.409

## 2011-02-25 HISTORY — DX: Atrial septal defect: Q21.1

## 2011-02-25 HISTORY — DX: Abscess of lung without pneumonia: J85.2

## 2011-02-25 HISTORY — DX: Atrial septal defect, unspecified: Q21.10

## 2011-02-25 LAB — POCT URINALYSIS DIP (DEVICE)
Glucose, UA: NEGATIVE mg/dL
Nitrite: NEGATIVE
Urobilinogen, UA: 0.2 mg/dL (ref 0.0–1.0)

## 2011-02-25 LAB — POCT I-STAT, CHEM 8
Glucose, Bld: 89 mg/dL (ref 70–99)
HCT: 45 % (ref 36.0–46.0)
Hemoglobin: 15.3 g/dL — ABNORMAL HIGH (ref 12.0–15.0)
Potassium: 4.3 mEq/L (ref 3.5–5.1)
Sodium: 142 mEq/L (ref 135–145)

## 2011-02-25 LAB — CBC
MCV: 90.4 fL (ref 78.0–100.0)
Platelets: 261 10*3/uL (ref 150–400)
RDW: 12.9 % (ref 11.5–15.5)
WBC: 8.4 10*3/uL (ref 4.0–10.5)

## 2011-02-25 LAB — POCT PREGNANCY, URINE: Preg Test, Ur: NEGATIVE

## 2011-02-25 MED ORDER — LEVOTHYROXINE SODIUM 50 MCG PO TABS: 50.0000 ug | ORAL_TABLET | Freq: Every day | ORAL | Status: DC

## 2011-02-25 MED ORDER — LEVOTHYROXINE SODIUM 25 MCG PO TABS: 25.0000 ug | ORAL_TABLET | Freq: Every day | ORAL | Status: DC

## 2011-02-25 NOTE — Discharge Instructions (Signed)
We have discussed today your various symptoms. We will you'll your lab exams and x-ray results. Have recommended you double the dose of your Synthroid with thyroid medicine, and followup within the next one to 2 weeks with her primary care doctor at [facility]. We also discussed what symptoms were require you should go immediately to the emergency department for further evaluation. Increase discomfort as in chest pain, shortness of breath, fevers, severe headaches,

## 2011-02-25 NOTE — ED Provider Notes (Signed)
History     CSN: [id number]  Arrival date & time 02/25/11  1645   First MD Initiated Contact with Patient 02/25/11 1724      Chief Complaint  Patient presents with  . Emesis    (Consider location/radiation/quality/duration/timing/severity/associated sxs/prior treatment) HPI Comments: Patient presents urgent care with multiple symptoms most remarkable that she is describing right-sided chest pain for more than a week with significant discomfort when she takes a deep breath. She describes that she has vomited about 2 times yesterday and then she's been feeling very tired and having on and off sensation of chills. She gets hot and cold suddenly.  She describes she has had many medical conditions within the last couple of years as stated in her medical history he describes her primary care doctor is that he'll serve  Patient is a 26 y.o. female presenting with chest pain. The history is provided by the patient.  Chest Pain The chest pain began more than 2 weeks ago. Chest pain occurs constantly. The pain is associated with breathing and coughing. At its most intense, the pain is at 8/10. The quality of the pain is described as aching. Chest pain is worsened by deep breathing and certain positions. Primary symptoms include fatigue, shortness of breath, cough, palpitations, nausea, vomiting and dizziness. Pertinent negatives for primary symptoms include no wheezing.  The palpitations also occurred with dizziness and shortness of breath.   Dizziness also occurs with nausea, vomiting and weakness.   Associated symptoms include weakness. She tried nothing for the symptoms.  Her past medical history is significant for DVT.     Past Medical History  Diagnosis Date  . Hypothyroid   . DVT (deep venous thrombosis)   . Pulmonary abscess   . Atrial septal defect   . MRSA carrier     History reviewed. No pertinent past surgical history.  History reviewed. No pertinent family  history.  History  Substance Use Topics  . Smoking status: Current Everyday Smoker  . Smokeless tobacco: Not on file  . Alcohol Use: Yes     occasional    OB History    Grav Para Term Preterm Abortions TAB SAB Ect Mult Living                  Review of Systems  Constitutional: Positive for appetite change and fatigue. Negative for activity change and unexpected weight change.  HENT: Negative for neck pain, neck stiffness and postnasal drip.   Eyes: Negative for pain.  Respiratory: Positive for cough and shortness of breath. Negative for wheezing.   Cardiovascular: Positive for chest pain and palpitations.  Gastrointestinal: Positive for nausea and vomiting.  Neurological: Positive for dizziness and weakness.    Allergies  Guaifenesin; Hydrocodone; and Morphine and related  Home Medications   Current Outpatient Rx  Name Route Sig Dispense Refill  . BUPROPION HCL ER (SR) 200 MG PO TB12 Oral Take 200 mg by mouth 2 (two) times daily.      Marland Kitchen LEVOTHYROXINE SODIUM 25 MCG PO TABS Oral Take 1 tablet (25 mcg total) by mouth daily. 15 tablet 0  . LEVOTHYROXINE SODIUM 50 MCG PO TABS Oral Take 1 tablet (50 mcg total) by mouth daily. 15 tablet 0  . LISDEXAMFETAMINE DIMESYLATE 70 MG PO CAPS Oral Take 70 mg by mouth every morning.        BP 97/65  Pulse 62  Temp(Src) 98.7 F (37.1 C) (Oral)  Resp 16  SpO2 99%  LMP 02/06/2011  Physical Exam  Nursing note and vitals reviewed. Constitutional: She is oriented to person, place, and time. She appears well-developed and well-nourished. No distress.  HENT:  Head: Normocephalic.  Right Ear: Tympanic membrane normal.  Left Ear: Tympanic membrane normal.  Mouth/Throat: Uvula is midline, oropharynx is clear and moist and mucous membranes are normal.  Eyes: Conjunctivae are normal. Right eye exhibits no discharge. Left eye exhibits no discharge.  Neck: Neck supple. JVD present.  Cardiovascular: Normal rate, regular rhythm, normal heart  sounds and normal pulses.   Pulmonary/Chest: Effort normal. No accessory muscle usage. No apnea, not tachypneic and not bradypneic. No respiratory distress. She has decreased breath sounds. She has no wheezes. She has no rhonchi. She has no rales.  Abdominal: She exhibits no distension. There is no tenderness. There is no rebound.  Neurological: She is alert and oriented to person, place, and time.  Skin: Skin is warm. No rash noted. No erythema.    ED Course  Procedures (including critical care time)  Labs Reviewed  POCT URINALYSIS DIP (DEVICE) - Abnormal; Notable for the following:    Ketones, ur TRACE (*)    All other components within normal limits  D-DIMER, QUANTITATIVE - Abnormal; Notable for the following:    D-Dimer, Quant 1.13 (*)    All other components within normal limits  POCT I-STAT, CHEM 8 - Abnormal; Notable for the following:    Hemoglobin 15.3 (*)    All other components within normal limits  POCT PREGNANCY, URINE  CBC  CBC WITH DIFFERENTIAL  D-DIMER, QUANTITATIVE   Dg Chest 2 View  02/25/2011  *RADIOLOGY REPORT*  Clinical Data: Chest pain  CHEST - 2 VIEW  Comparison: 02/09/2010  Findings: Normal heart size.  Clear lungs.  No pneumothorax.  No obvious acute bony deformity.  IMPRESSION: No active cardiopulmonary disease.  Original Report Authenticated By: Donavan Burnet, M.D.     1. Other symptoms involving respiratory system and chest   2. Other general symptoms       MDM   Patient is multi-symptomatic at urgent care today did admit that she was coughing a couple days ago when she's having some right upper chest wall pain. Given patient's  Remarkable medical history we decided to go to more in detail workup. Patient patient symptoms include tiredness and nonspecific gastrointestinal symptoms within and some sensitive changes 2 temperature and chronic fatigue. Suspect patient is probably having a thyroid hypofunction symptoms have explained to her that would  like her to double her dose and followup with her PCP for further lab work in evaluation of her symptoms. We also discuss if further symptoms and will require for her to go immediately to the emergency department for further evaluation. Patient is comfortable sitting in no respiratory distress and no obvious discomfort.     Jimmie Molly, MD 02/25/11 2015

## 2011-02-25 NOTE — ED Notes (Signed)
Pt  Has  Multiple   Complaints        They  Include  Chest  Pain  When  She  Takes  A  Deep  Breath  As  Well  As  Symptoms    Of  Vomiting  And  Feeling  Tired       She  Reports  That  She  Has  Low  thyriod   She  [facility]  [facility]     -  She  Reports  intermittant  Symptoms  Of  Feeling /  Hot  And  Cold    At  Intervals  -   She  Is  Awake  As  Well  As  Alert  And  orinted  She  Reports  Frequent  Urination as  Well

## 2011-03-15 ENCOUNTER — Telehealth (HOSPITAL_COMMUNITY): Payer: Self-pay | Admitting: *Deleted

## 2011-03-15 NOTE — ED Notes (Signed)
[facility] called and asked for TSH result. I told her we did not do on on this visit.  We did a d-dimer and it was elevated. I gave her that result.  Vassie Moselle 03/15/2011

## 2011-09-02 ENCOUNTER — Emergency Department (INDEPENDENT_AMBULATORY_CARE_PROVIDER_SITE_OTHER)
Admission: EM | Admit: 2011-09-02 | Discharge: 2011-09-02 | Disposition: A | Discharge status: Home / Self Care | Payer: Self-pay | Source: Home / Self Care | Attending: Emergency Medicine | Admitting: Emergency Medicine

## 2011-09-02 ENCOUNTER — Encounter (HOSPITAL_COMMUNITY): Payer: Self-pay

## 2011-09-02 DIAGNOSIS — W5501XA Bitten by cat, initial encounter: Secondary | ICD-10-CM

## 2011-09-02 DIAGNOSIS — Z760 Encounter for issue of repeat prescription: Secondary | ICD-10-CM

## 2011-09-02 DIAGNOSIS — R209 Unspecified disturbances of skin sensation: Secondary | ICD-10-CM

## 2011-09-02 DIAGNOSIS — Z23 Encounter for immunization: Secondary | ICD-10-CM

## 2011-09-02 DIAGNOSIS — IMO0001 Reserved for inherently not codable concepts without codable children: Secondary | ICD-10-CM

## 2011-09-02 HISTORY — DX: Depression, unspecified: F32.A

## 2011-09-02 HISTORY — DX: Major depressive disorder, single episode, unspecified: F32.9

## 2011-09-02 HISTORY — DX: Attention-deficit hyperactivity disorder, unspecified type: F90.9

## 2011-09-02 MED ORDER — TETANUS-DIPHTH-ACELL PERTUSSIS 5-2.5-18.5 LF-MCG/0.5 IM SUSP
INTRAMUSCULAR | Status: AC
  Filled 2011-09-02: qty 0.5

## 2011-09-02 MED ORDER — LEVOTHYROXINE SODIUM 25 MCG PO TABS: 25.0000 ug | ORAL_TABLET | Freq: Every day | ORAL | Status: DC

## 2011-09-02 MED ORDER — AMOXICILLIN-POT CLAVULANATE 875-125 MG PO TABS: 1.0000 | ORAL_TABLET | Freq: Two times a day (BID) | ORAL | Status: AC

## 2011-09-02 MED ORDER — AMOXICILLIN-POT CLAVULANATE 875-125 MG PO TABS: 1.0000 | ORAL_TABLET | Freq: Two times a day (BID) | ORAL | Status: DC

## 2011-09-02 MED ORDER — TETANUS-DIPHTH-ACELL PERTUSSIS 5-2.5-18.5 LF-MCG/0.5 IM SUSP
0.5000 mL | Freq: Once | INTRAMUSCULAR | Status: AC
  Administered 2011-09-02: 0.5 mL via INTRAMUSCULAR

## 2011-09-02 NOTE — ED Notes (Signed)
Last tetanus unknown

## 2011-09-02 NOTE — ED Provider Notes (Signed)
History     CSN: [id number]  Arrival date & time 09/02/11  1128   First MD Initiated Contact with Patient 09/02/11 1134      Chief Complaint  Patient presents with  . Animal Bite    (Consider location/radiation/quality/duration/timing/severity/associated sxs/prior treatment) Patient is a 26 y.o. female presenting with animal bite. The history is provided by the patient.  Animal Bite  The incident occurred just prior to arrival. Incident location: at [facility] office. She came to the ER via personal transport. There is an injury to the left index finger. The pain is moderate (pain shooting up left arm). It is unlikely that a foreign body is present. Associated symptoms include numbness. Pertinent negatives include no chest pain, no abdominal pain, no bowel incontinence, no nausea, no vomiting, no headaches, no inability to bear weight, no neck pain, no focal weakness, no decreased responsiveness, no light-headedness, no loss of consciousness, no seizures, no tingling, no weakness, no cough and no difficulty breathing. Her tetanus status is unknown. She has been behaving normally. She has received no recent medical care.    Patient also requests refill of Synthroid, has been unable to get a refill or see primary care provider since [facility].  Reports last lab draw one year ago, has been on same dose for many years.  Past Medical History  Diagnosis Date  . Hypothyroid   . DVT (deep venous thrombosis)   . Pulmonary abscess   . Atrial septal defect   . MRSA carrier   . Depression   . ADHD (attention deficit hyperactivity disorder)     Past Surgical History  Procedure Date  . Nasal sinus surgery     No family history on file.  History  Substance Use Topics  . Smoking status: Current Everyday Smoker  . Smokeless tobacco: Not on file  . Alcohol Use: Yes     occasional    OB History    Grav Para Term Preterm Abortions TAB SAB Ect Mult Living                   Review of Systems  Constitutional: Negative for decreased responsiveness.  HENT: Negative for neck pain.   Respiratory: Negative for cough.   Cardiovascular: Negative for chest pain.  Gastrointestinal: Negative for nausea, vomiting, abdominal pain and bowel incontinence.  Neurological: Positive for numbness. Negative for tingling, focal weakness, seizures, loss of consciousness, weakness, light-headedness and headaches.  All other systems reviewed and are negative.    Allergies  Guaifenesin; Hydrocodone; and Morphine and related  Home Medications   Current Outpatient Rx  Name Route Sig Dispense Refill  . BUPROPION HCL ER (SR) 200 MG PO TB12 Oral Take 200 mg by mouth 2 (two) times daily.      Marland Kitchen LISDEXAMFETAMINE DIMESYLATE 70 MG PO CAPS Oral Take 70 mg by mouth every morning.      Marland Kitchen AMOXICILLIN-POT CLAVULANATE 875-125 MG PO TABS Oral Take 1 tablet by mouth every 12 (twelve) hours. 10 tablet 0  . LEVOTHYROXINE SODIUM 25 MCG PO TABS Oral Take 1 tablet (25 mcg total) by mouth daily. 30 tablet 2    BP 118/82  Pulse 84  Temp 98.6 F (37 C) (Oral)  Resp 16  SpO2 100%  LMP 08/15/2011  Physical Exam  Nursing note and vitals reviewed. Constitutional: She is oriented to person, place, and time. Vital signs are normal. She appears well-developed and well-nourished. She is active and cooperative.  HENT:  Head: Normocephalic.  Eyes: Conjunctivae are normal. Pupils are equal, round, and reactive to light. No scleral icterus.  Neck: Trachea normal. Neck supple.  Cardiovascular: Normal rate, regular rhythm and intact distal pulses.   Pulmonary/Chest: Effort normal and breath sounds normal. No respiratory distress. She has no wheezes.  Musculoskeletal:       Left hand: Normal. normal sensation noted. Normal strength noted.       Hands: Lymphadenopathy:    She has no cervical adenopathy.    She has no axillary adenopathy.  Neurological: She is alert and oriented to person, place, and  time. No cranial nerve deficit or sensory deficit.  Skin: Skin is warm and dry.       Puncture wound to distal left index finger, tenderness and swelling and proximal nail plate.  Psychiatric: She has a normal mood and affect. Her speech is normal and behavior is normal. Judgment and thought content normal. Cognition and memory are normal.    ED Course  Procedures (including critical care time)   Labs Reviewed  TSH   No results found.   1. Cat bite   2. Medication refill       MDM  Keep wound clean and dry.  Take antibiotics as prescribed.  Will obtain TSH and refill levothyroxine x 2. Resources provided for primary care providers in the community.         Johnsie Kindred, NP 09/02/11 1333

## 2011-09-02 NOTE — ED Notes (Signed)
Pt states she was at the [facility] with her cat approx one hour ago.  Was holding her cat's mouth open to help vet administer medication and her cat bit her lt index finger.  Cat's shots are up to date.

## 2011-09-02 NOTE — ED Provider Notes (Signed)
Medical screening examination/treatment/procedure(s) were performed by non-physician practitioner and as supervising physician I was immediately available for consultation/collaboration.  Raynald Blend, MD 09/02/11 2259

## 2011-09-03 LAB — TSH: TSH: 2.575 u[IU]/mL (ref 0.350–4.500)

## 2011-11-07 IMAGING — CT CT CHEST W/ CM
2 of 4 series · 15 of 36 positions shown, 18 images · IV contrast (agent unspecified)
Comparison: Chest x-ray from earlier today

CLINICAL DATA: Cough, fever, elevated white blood cell count,
abnormal chest x-ray

CT CHEST WITH CONTRAST
TECHNIQUE: Multidetector CT imaging of the chest was performed
following the standard protocol during bolus administration of
intravenous contrast.
Contrast: 80 ml [id number]

[Series 2: chest with st · axial · 0.67mm/px · z∈[-320,-36]mm · 12 of 67 slices shown, 15 images]
[im 5/67  mediastinal]
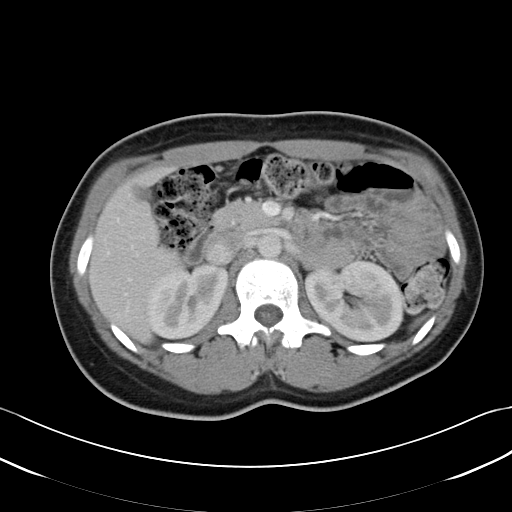
[im 5/67  lung]
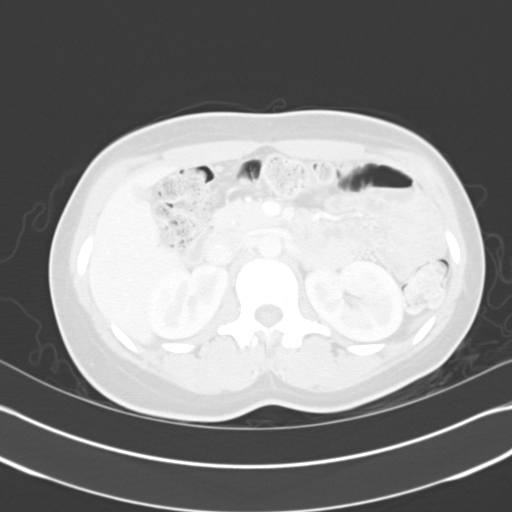
[im 9/67  lung]
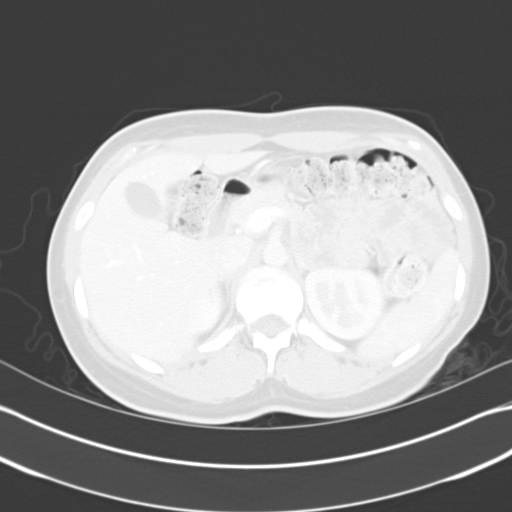
[im 14/67  lung]
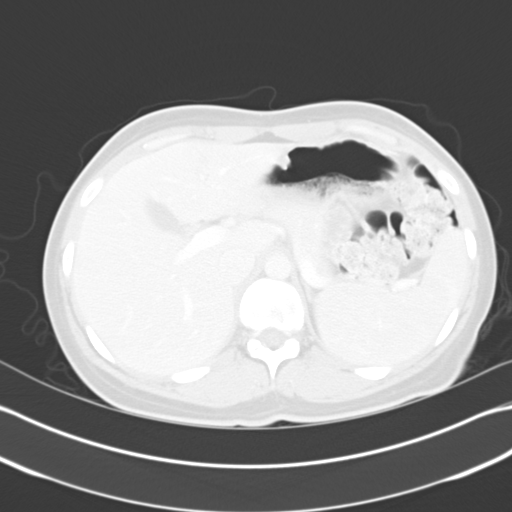
[im 23/67  lung]
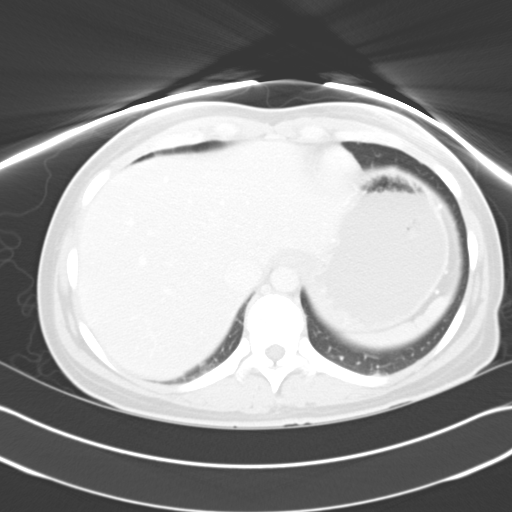
[im 27/67  mediastinal]
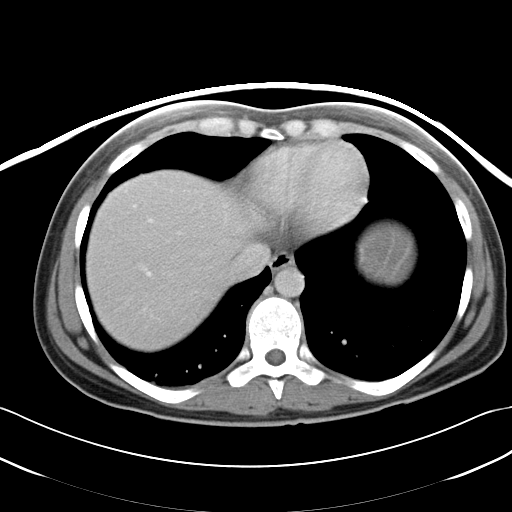
[im 27/67  lung]
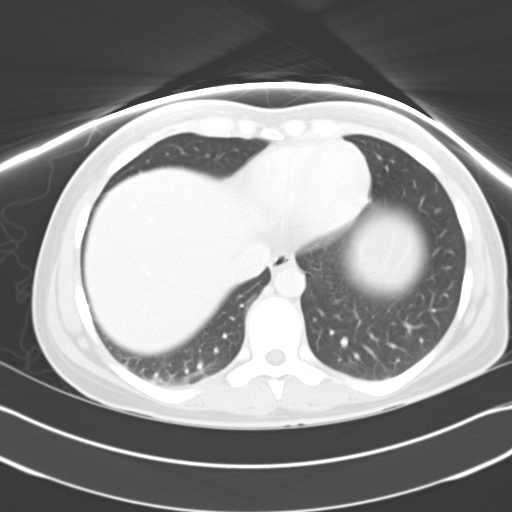
[im 31/67  lung]
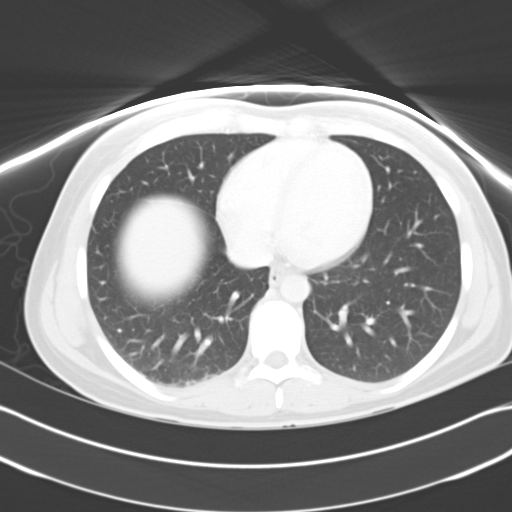
[im 36/67  lung]
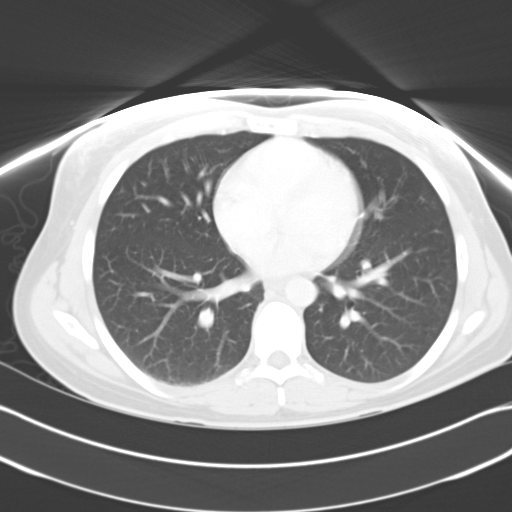
[im 40/67  lung]
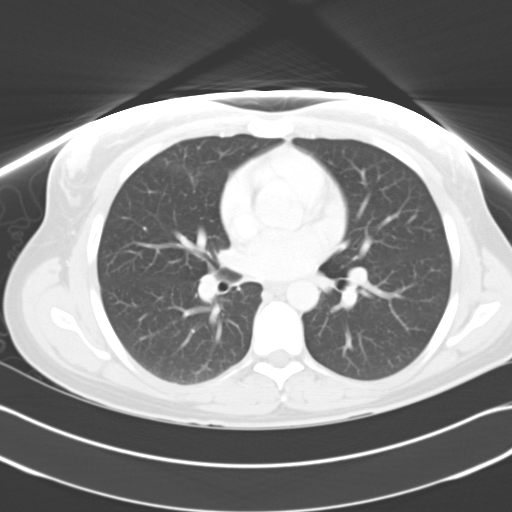
[im 45/67  mediastinal]
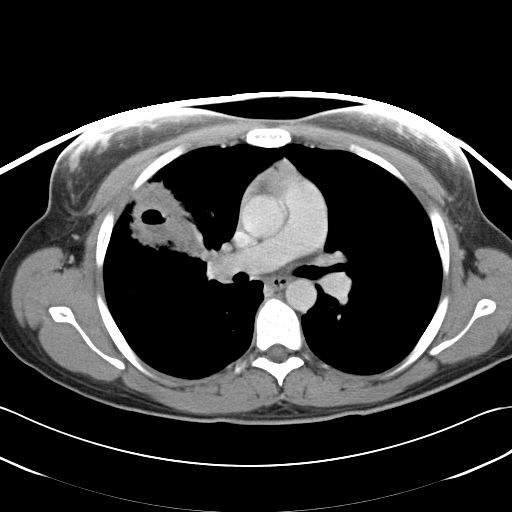
[im 45/67  lung]
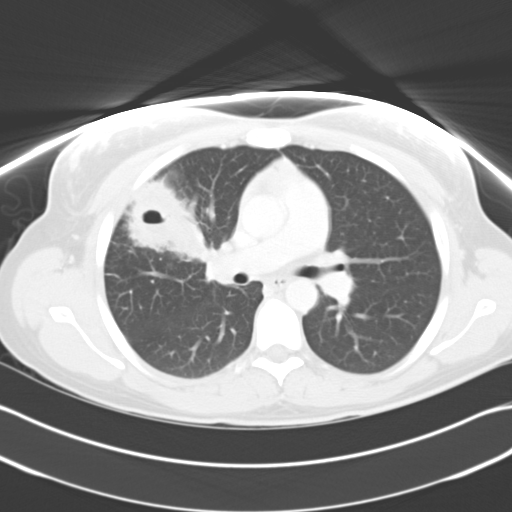
[im 53/67  lung]
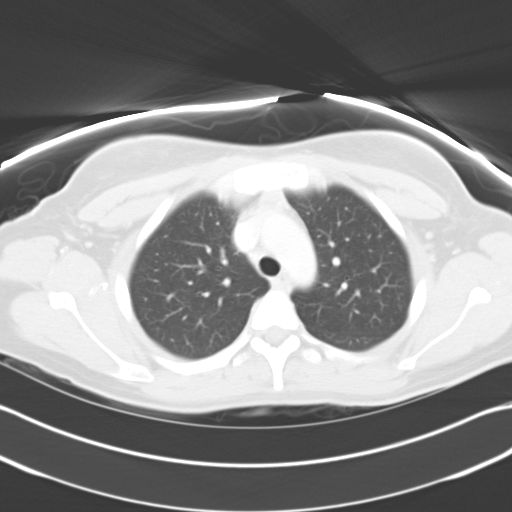
[im 58/67  lung]
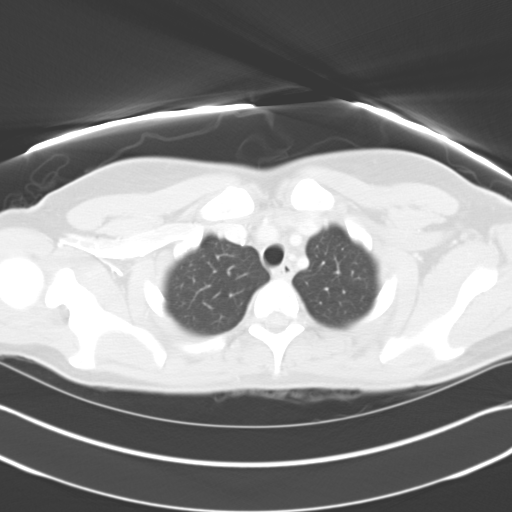
[im 62/67  lung]
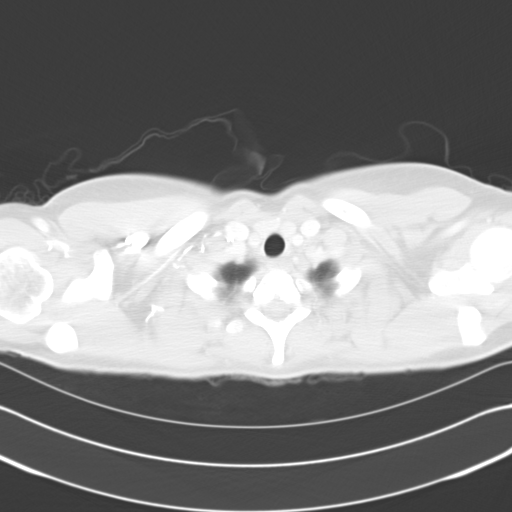

[Series 602: coronal images · coronal · 0.67mm/px · 3 of 80 slices shown]
[im 16/80  lung]
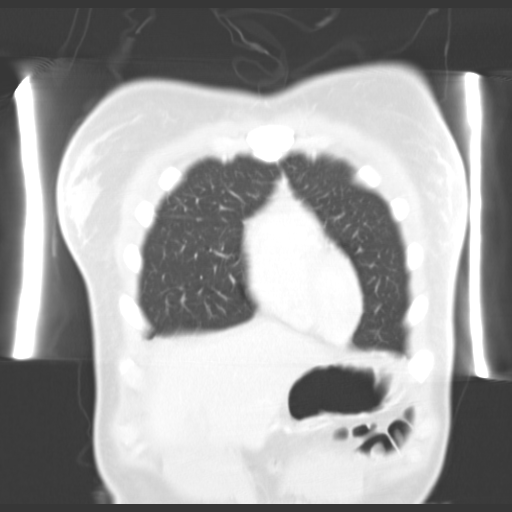
[im 32/80  lung]
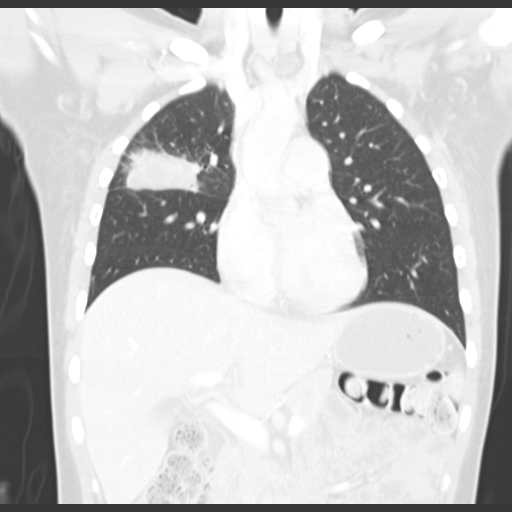
[im 48/80  lung]
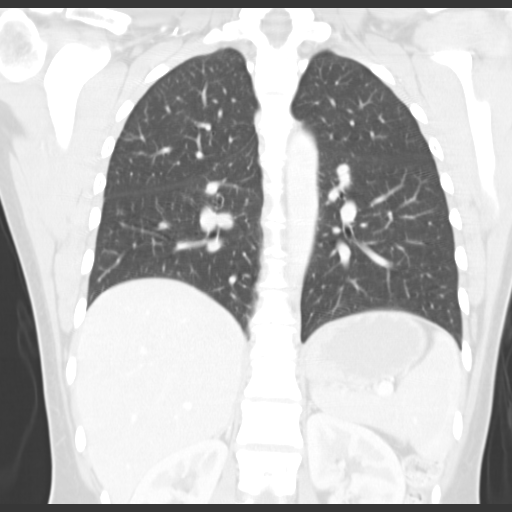

[15 of 36 positions shown; findings below may reference images not displayed]

FINDINGS: The abnormality noted on chest x-ray corresponds with an
oval focus of opacity within the anterior inferior right upper lobe
abutting the fissure with central cavitation.  This lesion has
somewhat irregular margins and does extend toward the left hilum
where there are a few slightly prominent lymph nodes present.
Although this probably represents a lung abscess and surrounding
pneumonia, a necrotic tumor cannot be excluded.  A small nodule is
noted in the right upper lobe just above the oval opacity which
could represent adjacent inflammatory change versus metastatic
lesion.  The left lung is clear.  No pleural effusion is seen.

On soft tissue window images, the thyroid gland is grossly normal
in size.  There are a few slightly prominent right hilar nodes
present which may be reactive secondary to possible lung abscess,
but neoplastic process and right hilar involvement is a
consideration.  No other adenopathy is seen.  The portion of the
liver that is visualized is unremarkable.
IMPRESSION: 1.  Oval opacity within the anterior inferior right upper lobe with
central cavitation.  Probable infectious process with abscess
development in this young patient, but a necrotic tumor cannot be
excluded.
2.  Right hilar adenopathy consistent either with reactive
adenopathy secondary to inflammatory process versus metastatic
involvement.

## 2011-11-07 IMAGING — CR DG CHEST 2V
2 series · 2 of 2 positions shown · non-contrast
Comparison: None.

CLINICAL DATA: Cough, fever, shortness of breath, smoking history

CHEST - 2 VIEW

[w chest pa]
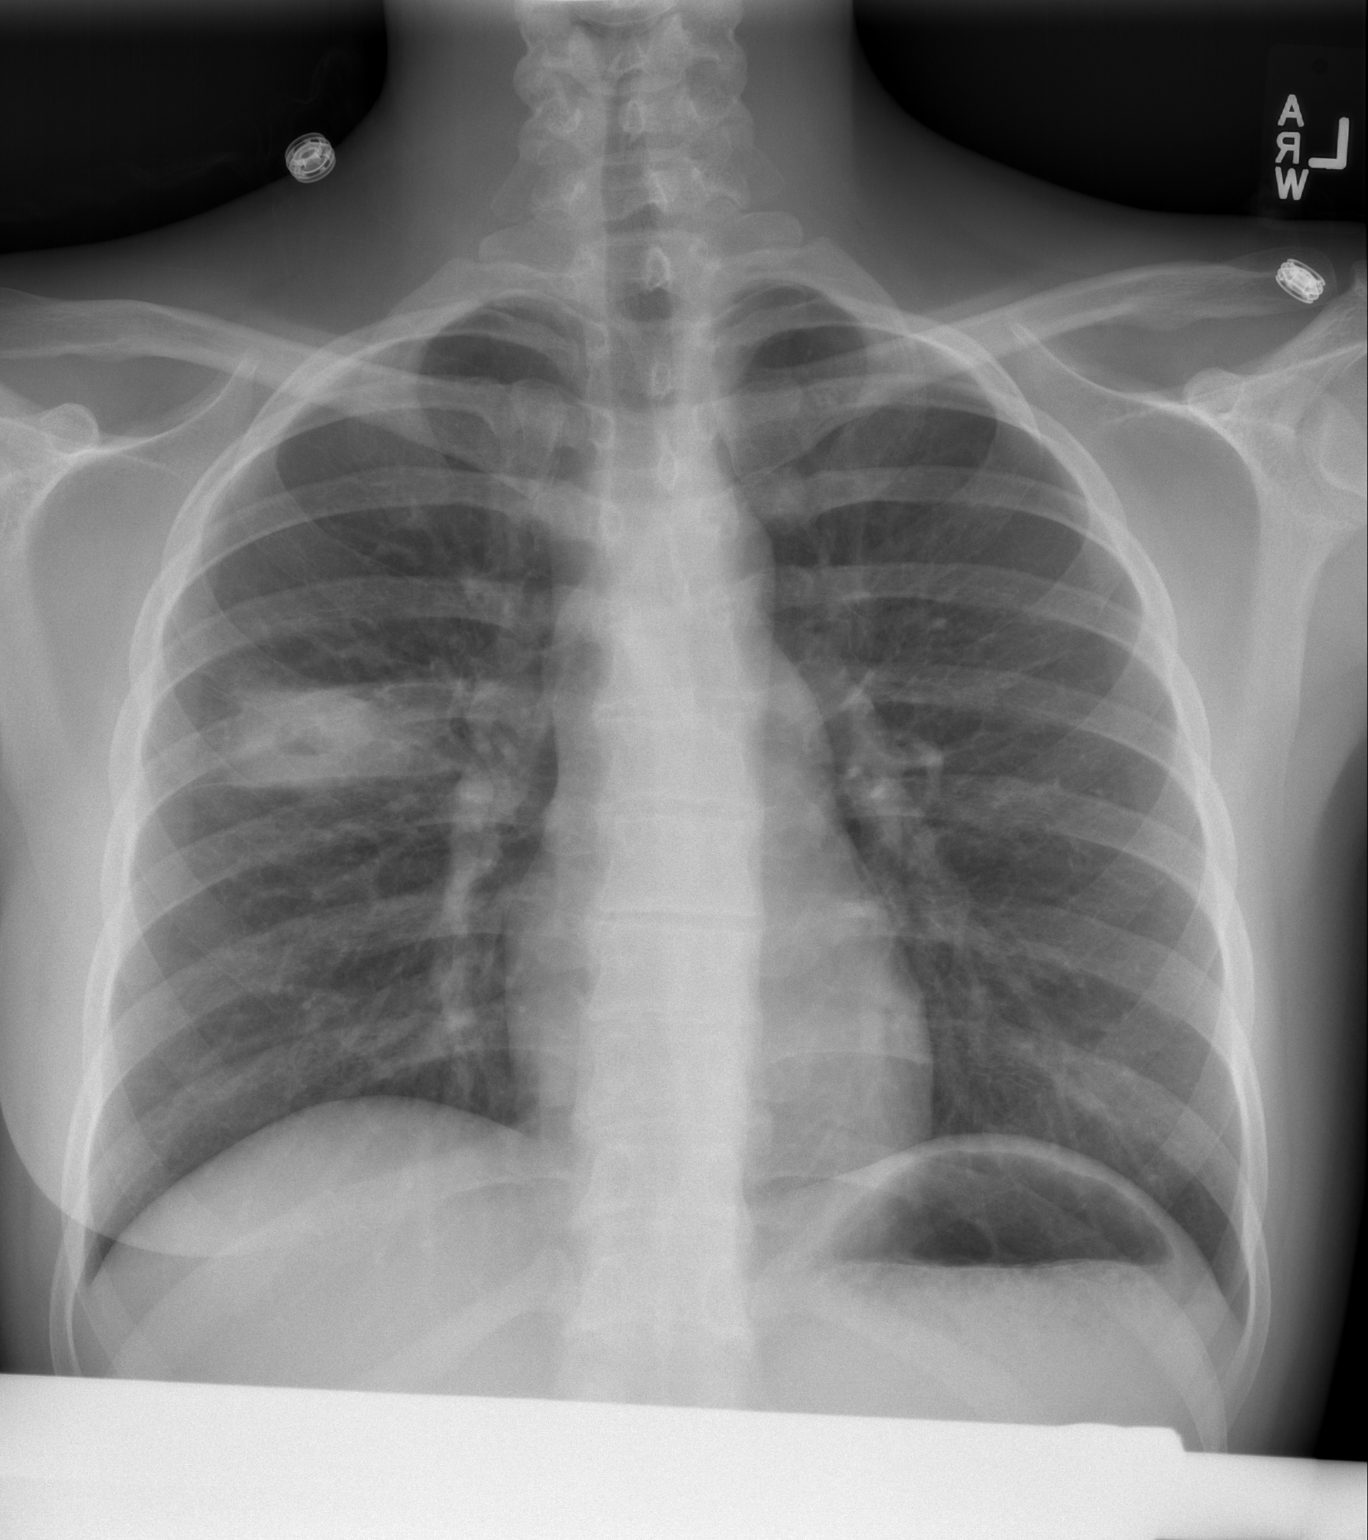

[w chest lat]
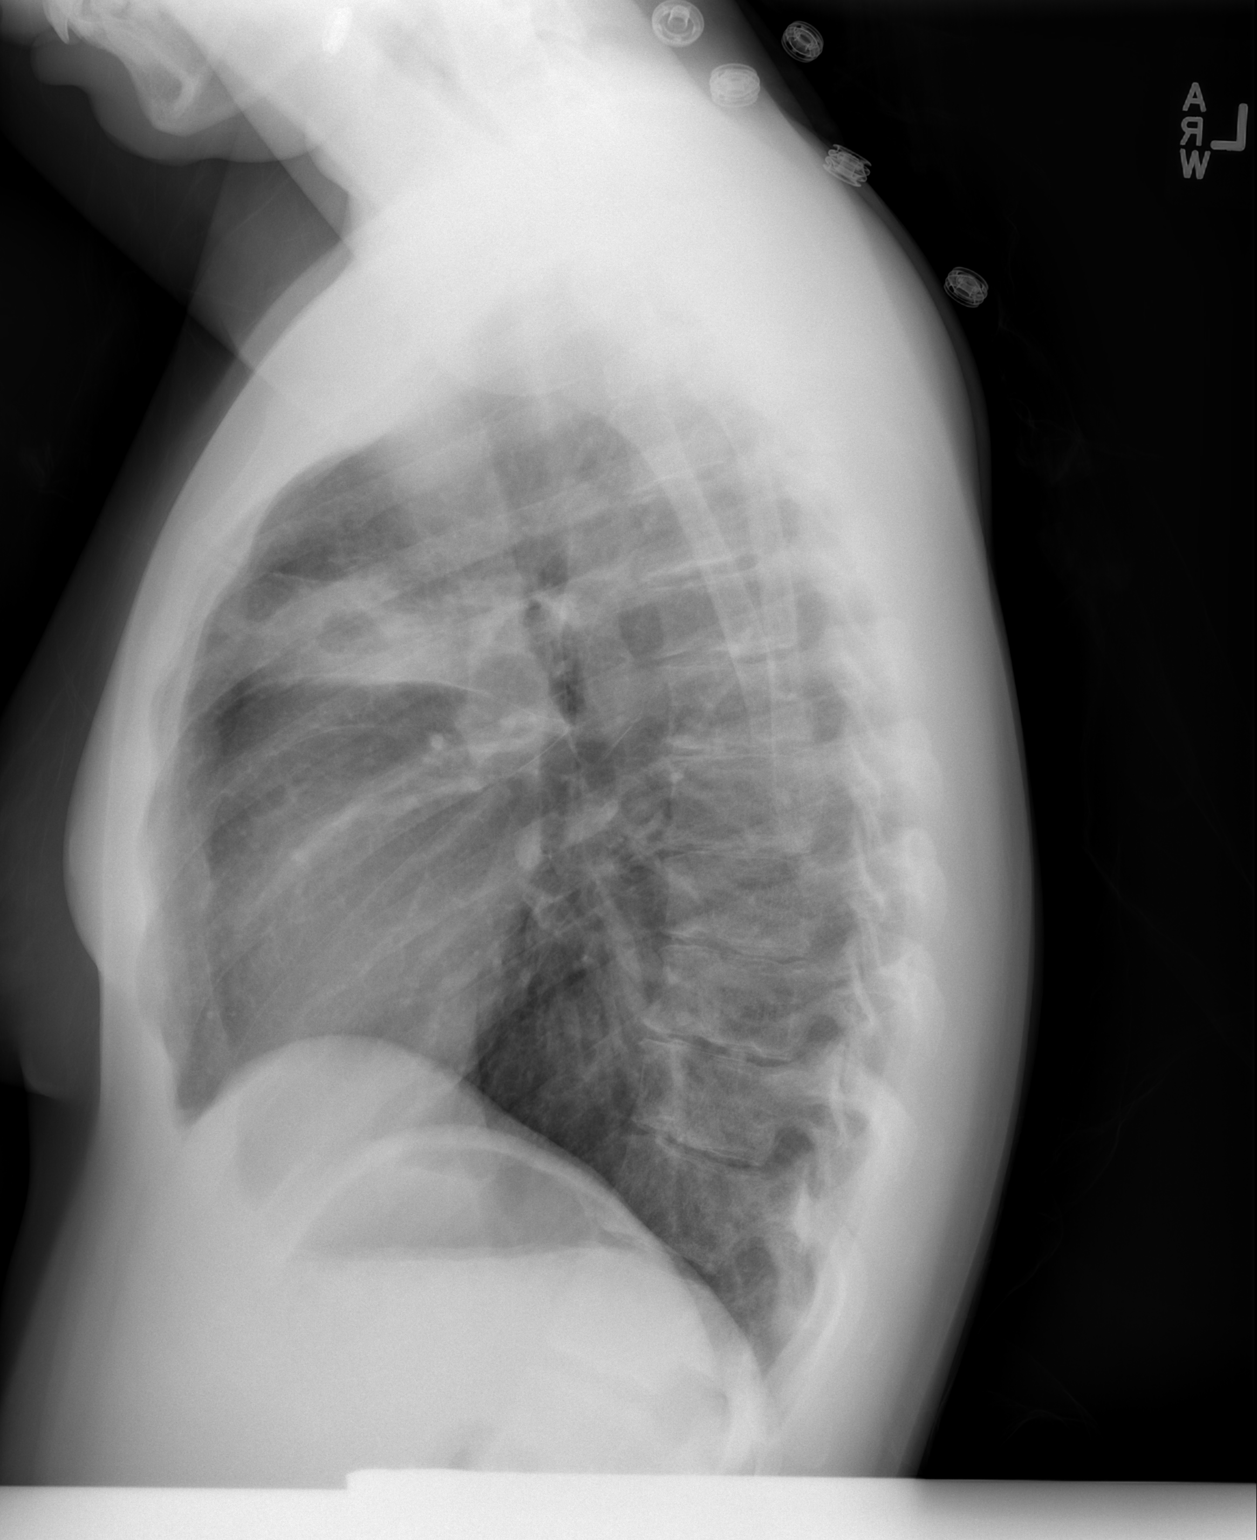

[2 of 2 positions shown; findings below may reference images not displayed]

FINDINGS: There is an opacity in the anterior right upper lobe
which appears to have central cavitation.  This may represent
infection and possible early abscess formation versus necrotic
tumor.  Follow-up chest x-ray is recommended and if this area does
not resolve then CT of the chest with IV contrast media would be
suggested.  Mediastinal contours appear normal.  The heart is
within normal limits in size.  No bony abnormality is seen.
IMPRESSION: Opacity in the anterior right upper lobe with apparent central
cavitation.  Consider infectious process with possible abscess
formation versus necrotic tumor.  Recommend follow-up chest x-ray
as noted above.

## 2011-11-08 ENCOUNTER — Telehealth: Payer: Self-pay

## 2011-11-08 NOTE — Telephone Encounter (Signed)
Spoke with patient to let her know that she will have to contact [facility] for her records.

## 2011-11-08 NOTE — Telephone Encounter (Signed)
Pt states she needs a copy of records to take with her for an appt on Wednesday morning. Needs to pick up tomorrow before 5 please.  Pt best: [phone]  bf

## 2012-10-05 ENCOUNTER — Ambulatory Visit: Payer: PRIVATE HEALTH INSURANCE | Attending: Internal Medicine

## 2012-10-19 ENCOUNTER — Ambulatory Visit: Payer: Self-pay | Attending: Internal Medicine | Admitting: Internal Medicine

## 2012-10-19 VITALS — BP 123/77 | HR 78 | Temp 98.4°F | Resp 17 | Wt 161.0 lb

## 2012-10-19 DIAGNOSIS — E039 Hypothyroidism, unspecified: Secondary | ICD-10-CM | POA: Insufficient documentation

## 2012-10-19 DIAGNOSIS — M069 Rheumatoid arthritis, unspecified: Secondary | ICD-10-CM | POA: Insufficient documentation

## 2012-10-19 DIAGNOSIS — Z23 Encounter for immunization: Secondary | ICD-10-CM

## 2012-10-19 DIAGNOSIS — F329 Major depressive disorder, single episode, unspecified: Secondary | ICD-10-CM

## 2012-10-19 NOTE — Progress Notes (Signed)
Patient here to establish care History of Rheumatoid arthritis Depression ADHD

## 2012-10-19 NOTE — Progress Notes (Signed)
Patient ID: Tonya Figueroa, female   DOB: December 30, 1985, 27 y.o.   MRN: [id number] Patient Demographics  Tonya Figueroa, is a 27 y.o. female  NWG:[id number]  [id number]  DOB - 1985-08-16  Chief Complaint  Patient presents with  . Establish Care        Subjective:   [name] today is here to establish primary care. Patient is a 27 year old female who was recently diagnosed with rheumatoid arthritis., has borderline hypothyroidism, depression presented here to establish care. 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 Patient reports the she is no longer in relationship with that person since then. She is currently working in a [employer].   Patient has No headache, No chest pain, No abdominal pain - No Nausea, No new weakness tingling or numbness, No Cough - SOB.   Objective:    Filed Vitals:   10/19/12 1137  BP: 123/77  Pulse: 78  Temp: 98.4 F (36.9 C)  Resp: 17  Weight: 161 lb (73.029 kg)  SpO2: 100%     ALLERGIES:   Allergies  Allergen Reactions  . Guaifenesin     REACTION: rash  . Hydrocodone Nausea And Vomiting  . Morphine And Related Nausea And Vomiting    PAST MEDICAL HISTORY: Past Medical History  Diagnosis Date  . Hypothyroid   . DVT (deep venous thrombosis)   . Pulmonary abscess   . Atrial septal defect   . MRSA carrier   . Depression   . ADHD (attention deficit hyperactivity disorder)     PAST SURGICAL HISTORY: Past Surgical History  Procedure Laterality Date  . Nasal sinus surgery      FAMILY HISTORY: No family history on file.  MEDICATIONS AT HOME: Prior to Admission medications   Medication Sig Start Date End Date Taking? Authorizing Provider  MELOXICAM PO Take by mouth.   Yes Historical Provider, MD  buPROPion (WELLBUTRIN SR) 200 MG 12 hr tablet Take 200 mg by mouth 2 (two) times daily.      Historical Provider, MD   levothyroxine (SYNTHROID, LEVOTHROID) 25 MCG tablet Take 1 tablet (25 mcg total) by mouth daily. 09/02/11   Johnsie Kindred, NP  lisdexamfetamine (VYVANSE) 70 MG capsule Take 70 mg by mouth every morning.      Historical Provider, MD    REVIEW OF SYSTEMS:  Constitutional:   No   Fevers, chills, fatigue.  HEENT:    No headaches, Sore throat,   Cardio-vascular: No chest pain,  Orthopnea, swelling in lower extremities, anasarca, palpitations  GI:  No abdominal pain, nausea, vomiting, diarrhea  Resp: No shortness of breath,  No coughing up of blood.No cough.No wheezing.  Skin:  no rash or lesions.  GU:  no dysuria, change in color of urine, no urgency or frequency.  No flank pain.  Musculoskeletal: Please see history of present illness she states that she has occasional flareup,  Psych: No change in mood or affect. No depression or anxiety.  No memory loss.   Exam  General appearance :Awake, alert, NAD, Speech Clear. HEENT: Atraumatic and Normocephalic, PERLA Neck: supple, no JVD. No cervical lymphadenopathy.  Chest: clear to auscultation bilaterally, no wheezing, rales or rhonchi CVS: S1 S2 regular, no murmurs.  Abdomen: soft, NBS, NT, ND, no gaurding, rigidity or rebound. Extremities: No cyanosis, clubbing, B/L Lower Ext shows no edema,  Neurology: Awake alert, and oriented X 3, CN II-XII  intact, Non focal Skin:No Rash or lesions Wounds: N/A    Data Review   Basic Metabolic Panel: No results found for this basename: NA, K, CL, CO2, GLUCOSE, BUN, CREATININE, CALCIUM, MG, PHOS,  in the last 168 hours Liver Function Tests: No results found for this basename: AST, ALT, ALKPHOS, BILITOT, PROT, ALBUMIN,  in the last 168 hours  CBC: No results found for this basename: WBC, NEUTROABS, HGB, HCT, MCV, PLT,  in the last 168 hours ------------------------------------------------------------------------------------------------------------------ No results found for this  basename: HGBA1C,  in the last 72 hours ------------------------------------------------------------------------------------------------------------------ No results found for this basename: CHOL, HDL, LDLCALC, TRIG, CHOLHDL, LDLDIRECT,  in the last 72 hours ------------------------------------------------------------------------------------------------------------------ No results found for this basename: TSH, T4TOTAL, FREET3, T3FREE, THYROIDAB,  in the last 72 hours ------------------------------------------------------------------------------------------------------------------ No results found for this basename: VITAMINB12, FOLATE, FERRITIN, TIBC, IRON, RETICCTPCT,  in the last 72 hours  Coagulation profile  No results found for this basename: INR, PROTIME,  in the last 168 hours    Assessment & Plan   Active Problems: Newly diagnosed rheumatoid arthritis - Stage meloxicam is helping, she does not need any prescriptions, ambulatory referral to rheumatology sent, patient needs to be on immunosuppressants and anti-inflammatory meds.  Hypothyroid: Patient says that she had her [facility] and TSH was fine - Continue levothyroxin - Recommended patient to bring all the labs paperwork to the clinic   Health screening - Flu shot today Ambulatory referral to GYN for Pap smear   Follow-up in 3 months   Zorawar Strollo M.D. 10/19/2012, 11:54 AM

## 2013-01-22 ENCOUNTER — Encounter: Payer: Self-pay | Admitting: Family Medicine

## 2013-01-23 ENCOUNTER — Encounter: Payer: Self-pay | Admitting: Internal Medicine

## 2013-01-23 ENCOUNTER — Ambulatory Visit: Payer: Self-pay | Attending: Internal Medicine | Admitting: Internal Medicine

## 2013-01-23 VITALS — BP 104/68 | HR 60 | Temp 98.7°F | Resp 16 | Ht 65.0 in | Wt 160.0 lb

## 2013-01-23 DIAGNOSIS — M069 Rheumatoid arthritis, unspecified: Secondary | ICD-10-CM | POA: Insufficient documentation

## 2013-01-23 DIAGNOSIS — E039 Hypothyroidism, unspecified: Secondary | ICD-10-CM | POA: Insufficient documentation

## 2013-01-23 MED ORDER — PROMETHAZINE HCL 25 MG PO TABS: 25.0000 mg | ORAL_TABLET | Freq: Three times a day (TID) | ORAL | Status: DC | PRN

## 2013-01-23 MED ORDER — ACETAMINOPHEN-CODEINE #3 300-30 MG PO TABS: 1.0000 | ORAL_TABLET | Freq: Four times a day (QID) | ORAL | Status: DC | PRN

## 2013-01-23 MED ORDER — TRAMADOL HCL 50 MG PO TABS: 50.0000 mg | ORAL_TABLET | Freq: Three times a day (TID) | ORAL | Status: DC | PRN

## 2013-01-23 NOTE — Progress Notes (Signed)
Pt is here today requesting a referral to an endocrinologist.

## 2013-01-23 NOTE — Progress Notes (Signed)
Patient ID: Tonya Figueroa, female   DOB: [birth year], 28 y.o.   MRN: [id number] Patient Demographics  Tonya Figueroa, is a 28 y.o. female  PPI:[id number]  AYT:[id number]  DOB - [birth year]  No chief complaint on file.       Subjective:   Tonya Figueroa is a 28 y.o. female here today for a follow up visit. Patient is known to have hypothyroidism, was recently sent to rheumatologist for a presumed diagnosis of rheumatoid arthritis, to rheumatologist evaluate the patient and felt otherwise saying the joint pains and stiffness this likely is a result of thyroid disease and requested the patient be referred to an endocrinologist. She has no new symptoms except for ongoing stiffness of the finger joints and pain. She also complained of dryness but has no constipation, no excessive cold intolerance. Patient is levothyroxine 25 mcg tablet by mouth daily and her last TSH is within normal limit. Patient was on IV drug use in the past, denies ongoing use, patient was demanding for narcotics. She continue to smoke cigarette and drinks alcohol Patient has No headache, No chest pain, No abdominal pain - No Nausea, No new weakness tingling or numbness, No Cough - SOB.  ALLERGIES: Allergies  Allergen Reactions  . Guaifenesin     REACTION: rash  . Hydrocodone Nausea And Vomiting  . Morphine And Related Nausea And Vomiting    PAST MEDICAL HISTORY: Past Medical History  Diagnosis Date  . Hypothyroid   . DVT (deep venous thrombosis)   . Pulmonary abscess   . Atrial septal defect   . MRSA carrier   . Depression   . ADHD (attention deficit hyperactivity disorder)     MEDICATIONS AT HOME: Prior to Admission medications   Medication Sig Start Date End Date Taking? Authorizing Provider  amphetamine-dextroamphetamine (ADDERALL) 20 MG tablet Take 20 mg by mouth daily.   Yes Historical Provider, MD  buPROPion (WELLBUTRIN SR) 200 MG 12 hr tablet Take 200 mg by mouth 2 (two) times daily.      Yes Historical Provider, MD  levothyroxine (SYNTHROID, LEVOTHROID) 25 MCG tablet Take 1 tablet (25 mcg total) by mouth daily. 09/02/11  Yes Johnsie Kindred, NP  lisdexamfetamine (VYVANSE) 70 MG capsule Take 70 mg by mouth every morning.     Yes Historical Provider, MD  acetaminophen-codeine (TYLENOL #3) 300-30 MG per tablet Take 1 tablet by mouth every 6 (six) hours as needed for moderate pain. 01/23/13   Jeanann Lewandowsky, MD  MELOXICAM PO Take by mouth.    Historical Provider, MD  promethazine (PHENERGAN) 25 MG tablet Take 1 tablet (25 mg total) by mouth every 8 (eight) hours as needed for nausea or vomiting. 01/23/13   Jeanann Lewandowsky, MD  traMADol (ULTRAM) 50 MG tablet Take 1 tablet (50 mg total) by mouth every 8 (eight) hours as needed. 01/23/13   Jeanann Lewandowsky, MD     Objective:   Filed Vitals:   01/23/13 1158  BP: 104/68  Pulse: 60  Temp: 98.7 F (37.1 C)  TempSrc: Oral  Resp: 16  Height: 5\' 5"  (1.651 m)  Weight: 160 lb (72.576 kg)  SpO2: 99%    Exam General appearance : Awake, alert, not in any distress. Speech Clear. Not toxic looking HEENT: Atraumatic and Normocephalic, pupils equally reactive to light and accomodation Neck: supple, no JVD. No cervical lymphadenopathy.  Chest:Good air entry bilaterally, no added sounds  CVS: S1 S2 regular, no murmurs.  Abdomen: Bowel sounds present, Non tender and not distended with no  gaurding, rigidity or rebound. Extremities: B/L Lower Ext shows no edema, both legs are warm to touch Neurology: Awake alert, and oriented X 3, CN II-XII intact, Non focal Skin:No Rash Wounds:N/A   Data Review   CBC No results found for this basename: WBC, HGB, HCT, PLT, MCV, MCH, MCHC, RDW, NEUTRABS, LYMPHSABS, MONOABS, EOSABS, BASOSABS, BANDABS, BANDSABD,  in the last 168 hours  Chemistries   No results found for this basename: NA, K, CL, CO2, GLUCOSE, BUN, CREATININE, GFRCGP, CALCIUM, MG, AST, ALT, ALKPHOS, BILITOT,  in the last 168  hours ------------------------------------------------------------------------------------------------------------------ No results found for this basename: HGBA1C,  in the last 72 hours ------------------------------------------------------------------------------------------------------------------ No results found for this basename: CHOL, HDL, LDLCALC, TRIG, CHOLHDL, LDLDIRECT,  in the last 72 hours ------------------------------------------------------------------------------------------------------------------ No results found for this basename: TSH, T4TOTAL, FREET3, T3FREE, THYROIDAB,  in the last 72 hours ------------------------------------------------------------------------------------------------------------------ No results found for this basename: VITAMINB12, FOLATE, FERRITIN, TIBC, IRON, RETICCTPCT,  in the last 72 hours  Coagulation profile  No results found for this basename: INR, PROTIME,  in the last 168 hours    Assessment & Plan   1. Rheumatoid arthritis  - acetaminophen-codeine (TYLENOL #3) 300-30 MG per tablet; Take 1 tablet by mouth every 6 (six) hours as needed for moderate pain.  Dispense: 60 tablet; Refill: 0 - promethazine (PHENERGAN) 25 MG tablet; Take 1 tablet (25 mg total) by mouth every 8 (eight) hours as needed for nausea or vomiting.  Dispense: 20 tablet; Refill: 0 - traMADol (ULTRAM) 50 MG tablet; Take 1 tablet (50 mg total) by mouth every 8 (eight) hours as needed.  Dispense: 60 tablet; Refill: 0  2. Unspecified hypothyroidism Patient was encouraged to sign a release form so we can get records from her rheumatologist as well as from previous medical encounters So far I do not see evidence of any thyroid disease from her laboratory tests but patient insists to be referred to an endocrinologist based on recommendation from her rheumatologist which was based on laboratory results available to him but we do not have access at the moment to those results  -  Ambulatory referral to Endocrinology  Follow up in 3 months or when necessary   The patient was given clear instructions to go to ER or return to medical center if symptoms don't improve, worsen or new problems develop. The patient verbalized understanding. The patient was told to call to get lab results if they haven't heard anything in the next week.    Jeanann Lewandowsky, MD, MHA, FACP, FAAP [facility] [place], [place] [phone]   01/23/2013, 12:42 PM

## 2013-03-02 ENCOUNTER — Telehealth: Payer: Self-pay | Admitting: Internal Medicine

## 2013-03-02 NOTE — Telephone Encounter (Signed)
Pt left voicemail message stating she needs refill for levothyroxine (SYNTHROID, LEVOTHROID) 25 MCG tablet. Please f/u with pt.

## 2013-03-05 ENCOUNTER — Telehealth: Payer: Self-pay | Admitting: Emergency Medicine

## 2013-03-05 NOTE — Telephone Encounter (Signed)
Left message for pt to call clinic when message received 

## 2013-03-16 NOTE — Telephone Encounter (Addendum)
Pt calling about med refill for Thyroid meds, says she has not received a call back since last week and now only has for one more day. Pt has appt with Endocrinologist on 05/04/13 and says she only needs refills until that appt.

## 2013-03-20 ENCOUNTER — Other Ambulatory Visit: Payer: Self-pay | Admitting: Emergency Medicine

## 2013-03-20 MED ORDER — LEVOTHYROXINE SODIUM 25 MCG PO TABS: 25.0000 ug | ORAL_TABLET | Freq: Every day | ORAL | Status: AC

## 2013-03-20 NOTE — Telephone Encounter (Signed)
Left message for pt to pick script @ pharmacy

## 2013-03-22 ENCOUNTER — Telehealth: Payer: Self-pay | Admitting: Internal Medicine

## 2013-03-22 NOTE — Telephone Encounter (Signed)
Pt called regarding refills of her medication, pt states that her pharmacy never received her medications. Please contact pt

## 2013-03-22 NOTE — Telephone Encounter (Signed)
Patient called inquiring about her thyroid medication i told her it was sent to the [facility] on [address]

## 2013-04-05 ENCOUNTER — Ambulatory Visit
Admission: RE | Admit: 2013-04-05 | Discharge: 2013-04-05 | Disposition: A | Discharge status: Home / Self Care | Payer: PRIVATE HEALTH INSURANCE | Source: Ambulatory Visit | Attending: Family Medicine | Admitting: Family Medicine

## 2013-04-05 ENCOUNTER — Other Ambulatory Visit: Payer: Self-pay | Admitting: Family Medicine

## 2013-04-05 DIAGNOSIS — E039 Hypothyroidism, unspecified: Secondary | ICD-10-CM

## 2013-04-24 ENCOUNTER — Ambulatory Visit: Payer: Self-pay | Admitting: Internal Medicine

## 2013-05-21 ENCOUNTER — Ambulatory Visit: Payer: PRIVATE HEALTH INSURANCE | Attending: Internal Medicine | Admitting: Occupational Therapy

## 2013-05-21 DIAGNOSIS — M25649 Stiffness of unspecified hand, not elsewhere classified: Secondary | ICD-10-CM | POA: Diagnosis not present

## 2013-05-21 DIAGNOSIS — M25549 Pain in joints of unspecified hand: Secondary | ICD-10-CM | POA: Insufficient documentation

## 2013-05-21 DIAGNOSIS — IMO0001 Reserved for inherently not codable concepts without codable children: Secondary | ICD-10-CM | POA: Diagnosis not present

## 2013-05-21 DIAGNOSIS — M6281 Muscle weakness (generalized): Secondary | ICD-10-CM | POA: Diagnosis not present

## 2013-05-21 DIAGNOSIS — M65839 Other synovitis and tenosynovitis, unspecified forearm: Secondary | ICD-10-CM | POA: Diagnosis not present

## 2013-05-21 DIAGNOSIS — M65849 Other synovitis and tenosynovitis, unspecified hand: Secondary | ICD-10-CM

## 2013-09-24 ENCOUNTER — Emergency Department (HOSPITAL_COMMUNITY)
Admission: EM | Admit: 2013-09-24 | Discharge: 2013-09-24 | Disposition: A | Discharge status: Home / Self Care | Payer: No Typology Code available for payment source | Attending: Emergency Medicine | Admitting: Emergency Medicine

## 2013-09-24 ENCOUNTER — Encounter (HOSPITAL_COMMUNITY): Payer: Self-pay | Admitting: Emergency Medicine

## 2013-09-24 DIAGNOSIS — F909 Attention-deficit hyperactivity disorder, unspecified type: Secondary | ICD-10-CM | POA: Insufficient documentation

## 2013-09-24 DIAGNOSIS — R21 Rash and other nonspecific skin eruption: Secondary | ICD-10-CM | POA: Insufficient documentation

## 2013-09-24 DIAGNOSIS — E039 Hypothyroidism, unspecified: Secondary | ICD-10-CM | POA: Diagnosis not present

## 2013-09-24 DIAGNOSIS — Z86718 Personal history of other venous thrombosis and embolism: Secondary | ICD-10-CM | POA: Insufficient documentation

## 2013-09-24 DIAGNOSIS — Z8709 Personal history of other diseases of the respiratory system: Secondary | ICD-10-CM | POA: Diagnosis not present

## 2013-09-24 DIAGNOSIS — Z3202 Encounter for pregnancy test, result negative: Secondary | ICD-10-CM | POA: Insufficient documentation

## 2013-09-24 DIAGNOSIS — M130 Polyarthritis, unspecified: Secondary | ICD-10-CM | POA: Diagnosis not present

## 2013-09-24 DIAGNOSIS — F329 Major depressive disorder, single episode, unspecified: Secondary | ICD-10-CM | POA: Insufficient documentation

## 2013-09-24 DIAGNOSIS — F3289 Other specified depressive episodes: Secondary | ICD-10-CM | POA: Diagnosis not present

## 2013-09-24 DIAGNOSIS — Z79899 Other long term (current) drug therapy: Secondary | ICD-10-CM | POA: Insufficient documentation

## 2013-09-24 DIAGNOSIS — Z8614 Personal history of Methicillin resistant Staphylococcus aureus infection: Secondary | ICD-10-CM | POA: Insufficient documentation

## 2013-09-24 DIAGNOSIS — F17200 Nicotine dependence, unspecified, uncomplicated: Secondary | ICD-10-CM | POA: Insufficient documentation

## 2013-09-24 LAB — CBC WITH DIFFERENTIAL/PLATELET
Basophils Absolute: 0 10*3/uL (ref 0.0–0.1)
Basophils Relative: 0 % (ref 0–1)
EOS PCT: 3 % (ref 0–5)
Eosinophils Absolute: 0.2 10*3/uL (ref 0.0–0.7)
HEMATOCRIT: 36.8 % (ref 36.0–46.0)
HEMOGLOBIN: 12.4 g/dL (ref 12.0–15.0)
LYMPHS ABS: 2.1 10*3/uL (ref 0.7–4.0)
LYMPHS PCT: 30 % (ref 12–46)
MCH: 30.8 pg (ref 26.0–34.0)
MCHC: 33.7 g/dL (ref 30.0–36.0)
MCV: 91.3 fL (ref 78.0–100.0)
MONO ABS: 0.7 10*3/uL (ref 0.1–1.0)
Monocytes Relative: 10 % (ref 3–12)
Neutro Abs: 4 10*3/uL (ref 1.7–7.7)
Neutrophils Relative %: 57 % (ref 43–77)
Platelets: 215 10*3/uL (ref 150–400)
RBC: 4.03 MIL/uL (ref 3.87–5.11)
RDW: 12.9 % (ref 11.5–15.5)
WBC: 7.1 10*3/uL (ref 4.0–10.5)

## 2013-09-24 LAB — COMPREHENSIVE METABOLIC PANEL
ALT: 25 U/L (ref 0–35)
AST: 36 U/L (ref 0–37)
Albumin: 3.6 g/dL (ref 3.5–5.2)
Alkaline Phosphatase: 34 U/L — ABNORMAL LOW (ref 39–117)
Anion gap: 10 (ref 5–15)
BUN: 8 mg/dL (ref 6–23)
CALCIUM: 9.1 mg/dL (ref 8.4–10.5)
CO2: 25 meq/L (ref 19–32)
CREATININE: 0.7 mg/dL (ref 0.50–1.10)
Chloride: 101 mEq/L (ref 96–112)
GLUCOSE: 108 mg/dL — AB (ref 70–99)
Potassium: 3.9 mEq/L (ref 3.7–5.3)
SODIUM: 136 meq/L — AB (ref 137–147)
Total Bilirubin: 0.3 mg/dL (ref 0.3–1.2)
Total Protein: 7.1 g/dL (ref 6.0–8.3)

## 2013-09-24 LAB — URINALYSIS, ROUTINE W REFLEX MICROSCOPIC
BILIRUBIN URINE: NEGATIVE
Glucose, UA: NEGATIVE mg/dL
HGB URINE DIPSTICK: NEGATIVE
Ketones, ur: NEGATIVE mg/dL
Leukocytes, UA: NEGATIVE
Nitrite: NEGATIVE
PH: 6.5 (ref 5.0–8.0)
Protein, ur: NEGATIVE mg/dL
SPECIFIC GRAVITY, URINE: 1.024 (ref 1.005–1.030)
Urobilinogen, UA: 1 mg/dL (ref 0.0–1.0)

## 2013-09-24 LAB — PREGNANCY, URINE: Preg Test, Ur: NEGATIVE

## 2013-09-24 LAB — PRO B NATRIURETIC PEPTIDE: Pro B Natriuretic peptide (BNP): 89.7 pg/mL (ref 0–125)

## 2013-09-24 MED ORDER — PREDNISONE 20 MG PO TABS
40.0000 mg | ORAL_TABLET | Freq: Once | ORAL | Status: AC
  Administered 2013-09-24: 40 mg via ORAL
  Filled 2013-09-24: qty 2

## 2013-09-24 MED ORDER — TRAMADOL HCL 50 MG PO TABS: 50.0000 mg | ORAL_TABLET | Freq: Four times a day (QID) | ORAL | Status: AC | PRN

## 2013-09-24 MED ORDER — PREDNISONE 20 MG PO TABS: 40.0000 mg | ORAL_TABLET | Freq: Once | ORAL | Status: AC

## 2013-09-24 MED ORDER — TRAMADOL HCL 50 MG PO TABS
50.0000 mg | ORAL_TABLET | Freq: Once | ORAL | Status: AC
  Administered 2013-09-24: 50 mg via ORAL
  Filled 2013-09-24: qty 1

## 2013-09-24 NOTE — ED Notes (Signed)
Pt hs multiple complaints. Pt states she has a hx of CHF, having body swelling, aching in her legs , had rash on face, and mouth feels dry.

## 2013-09-28 NOTE — ED Provider Notes (Signed)
CSN: [id number]     Arrival date & time 09/24/13  1119 History   First MD Initiated Contact with Patient 09/24/13 1739     Chief Complaint  Patient presents with  . body swelling   . Rash     (Consider location/radiation/quality/duration/timing/severity/associated sxs/prior Treatment) HPI  28 year old female multiple complaints. Patient is primarily complaining of pain in multiple joints. Bilateral hands, wrists, hips, knees. This has been ongoing for a period of several years. She's status post bilateral carpal tunnel release. Her hand pain do not steadily improve after this. She reports previous evaluation by rheumatology and blood work which did not show reveal specific etiology. Intermittent swelling most noticeable in her feet. Currently improved. No fevers or chills. No shortness of breath. No orthopnea. Will occasionally get a rash to her face and upper chest. Will burn slightly. This is currently resolved as well. Her mouth feels unusually dry. She denies any mouth sores.  Past Medical History  Diagnosis Date  . Hypothyroid   . DVT (deep venous thrombosis)   . Pulmonary abscess   . Atrial septal defect   . MRSA carrier   . Depression   . ADHD (attention deficit hyperactivity disorder)    Past Surgical History  Procedure Laterality Date  . Nasal sinus surgery     No family history on file. History  Substance Use Topics  . Smoking status: Current Every Day Smoker    Types: Cigarettes  . Smokeless tobacco: Not on file     Comment: 5 cigs/day  . Alcohol Use: Yes     Comment: occasional   OB History   Grav Para Term Preterm Abortions TAB SAB Ect Mult Living                 Review of Systems  All systems reviewed and negative, other than as noted in HPI.   Allergies  Guaifenesin; Hydrocodone; and Morphine and related  Home Medications   Prior to Admission medications   Medication Sig Start Date End Date Taking? Authorizing Provider  buPROPion (WELLBUTRIN SR)  200 MG 12 hr tablet Take 200 mg by mouth 2 (two) times daily.     Yes Historical Provider, MD  levothyroxine (SYNTHROID, LEVOTHROID) 25 MCG tablet Take 1 tablet (25 mcg total) by mouth daily. 03/20/13  Yes Quentin Angst, MD  predniSONE (DELTASONE) 20 MG tablet Take 2 tablets (40 mg total) by mouth once. 09/24/13   Raeford Razor, MD  traMADol (ULTRAM) 50 MG tablet Take 1 tablet (50 mg total) by mouth every 6 (six) hours as needed. 09/24/13   Raeford Razor, MD   BP 103/46  Pulse 75  Temp(Src) 97.7 F (36.5 C) (Oral)  Resp 16  SpO2 100%  LMP 09/20/2013 Physical Exam  Nursing note and vitals reviewed. Constitutional: She appears well-developed and well-nourished. No distress.  HENT:  Head: Normocephalic and atraumatic.  Eyes: Conjunctivae are normal. Right eye exhibits no discharge. Left eye exhibits no discharge.  Neck: Neck supple.  Cardiovascular: Normal rate, regular rhythm and normal heart sounds.  Exam reveals no gallop and no friction rub.   No murmur heard. Pulmonary/Chest: Effort normal and breath sounds normal. No respiratory distress.  Abdominal: Soft. She exhibits no distension. There is no tenderness.  Musculoskeletal: She exhibits no edema and no tenderness.  Lower extremities symmetric as compared to each other. No calf tenderness. Negative Homan's. No palpable cords.   Neurological: She is alert.  Skin: Skin is warm and dry.  Psychiatric: She has  a normal mood and affect. Her behavior is normal. Thought content normal.    ED Course  Procedures (including critical care time) Labs Review Labs Reviewed  COMPREHENSIVE METABOLIC PANEL - Abnormal; Notable for the following:    Sodium 136 (*)    Glucose, Bld 108 (*)    Alkaline Phosphatase 34 (*)    All other components within normal limits  URINALYSIS, ROUTINE W REFLEX MICROSCOPIC  PREGNANCY, URINE  CBC WITH DIFFERENTIAL  PRO B NATRIURETIC PEPTIDE    Imaging Review No results found.   EKG  Interpretation None      MDM   Final diagnoses:  Polyarthropathy    27yF with polyarthropathy and subjective swelling.  Symptoms seem most consistent with rheumatological process. Chronic. Plan course of steroids and PRN pain meds at this time. She is nontoxic. He is stable. Her exam self is pre-unremarkable. She has established rheumatology followup. Term precautions were discussed.    Raeford Razor, MD 09/28/13 [phone]

## 2013-12-05 ENCOUNTER — Other Ambulatory Visit: Payer: Self-pay | Admitting: Physician Assistant

## 2013-12-05 DIAGNOSIS — R197 Diarrhea, unspecified: Secondary | ICD-10-CM

## 2013-12-05 DIAGNOSIS — R1084 Generalized abdominal pain: Secondary | ICD-10-CM

## 2013-12-07 ENCOUNTER — Other Ambulatory Visit: Payer: PRIVATE HEALTH INSURANCE

## 2014-01-06 ENCOUNTER — Emergency Department (HOSPITAL_COMMUNITY)
Admission: EM | Admit: 2014-01-06 | Discharge: 2014-01-06 | Disposition: A | Discharge status: Home / Self Care | Payer: PRIVATE HEALTH INSURANCE | Attending: Emergency Medicine | Admitting: Emergency Medicine

## 2014-01-06 ENCOUNTER — Encounter (HOSPITAL_COMMUNITY): Payer: Self-pay | Admitting: Emergency Medicine

## 2014-01-06 DIAGNOSIS — Y9389 Activity, other specified: Secondary | ICD-10-CM | POA: Insufficient documentation

## 2014-01-06 DIAGNOSIS — Z8614 Personal history of Methicillin resistant Staphylococcus aureus infection: Secondary | ICD-10-CM | POA: Insufficient documentation

## 2014-01-06 DIAGNOSIS — T401X1A Poisoning by heroin, accidental (unintentional), initial encounter: Secondary | ICD-10-CM | POA: Insufficient documentation

## 2014-01-06 DIAGNOSIS — Z8709 Personal history of other diseases of the respiratory system: Secondary | ICD-10-CM | POA: Insufficient documentation

## 2014-01-06 DIAGNOSIS — F909 Attention-deficit hyperactivity disorder, unspecified type: Secondary | ICD-10-CM | POA: Insufficient documentation

## 2014-01-06 DIAGNOSIS — Y998 Other external cause status: Secondary | ICD-10-CM | POA: Insufficient documentation

## 2014-01-06 DIAGNOSIS — R0602 Shortness of breath: Secondary | ICD-10-CM | POA: Insufficient documentation

## 2014-01-06 DIAGNOSIS — Q211 Atrial septal defect: Secondary | ICD-10-CM | POA: Insufficient documentation

## 2014-01-06 DIAGNOSIS — Z7952 Long term (current) use of systemic steroids: Secondary | ICD-10-CM | POA: Insufficient documentation

## 2014-01-06 DIAGNOSIS — Z79899 Other long term (current) drug therapy: Secondary | ICD-10-CM | POA: Insufficient documentation

## 2014-01-06 DIAGNOSIS — Y9289 Other specified places as the place of occurrence of the external cause: Secondary | ICD-10-CM | POA: Insufficient documentation

## 2014-01-06 DIAGNOSIS — F1310 Sedative, hypnotic or anxiolytic abuse, uncomplicated: Secondary | ICD-10-CM | POA: Insufficient documentation

## 2014-01-06 DIAGNOSIS — E039 Hypothyroidism, unspecified: Secondary | ICD-10-CM | POA: Insufficient documentation

## 2014-01-06 DIAGNOSIS — R079 Chest pain, unspecified: Secondary | ICD-10-CM | POA: Insufficient documentation

## 2014-01-06 DIAGNOSIS — Z720 Tobacco use: Secondary | ICD-10-CM | POA: Insufficient documentation

## 2014-01-06 DIAGNOSIS — F329 Major depressive disorder, single episode, unspecified: Secondary | ICD-10-CM | POA: Insufficient documentation

## 2014-01-06 DIAGNOSIS — Z86718 Personal history of other venous thrombosis and embolism: Secondary | ICD-10-CM | POA: Insufficient documentation

## 2014-01-06 DIAGNOSIS — F1510 Other stimulant abuse, uncomplicated: Secondary | ICD-10-CM | POA: Insufficient documentation

## 2014-01-06 LAB — COMPREHENSIVE METABOLIC PANEL
ALBUMIN: 4.1 g/dL (ref 3.5–5.2)
ALK PHOS: 40 U/L (ref 39–117)
ALT: 47 U/L — ABNORMAL HIGH (ref 0–35)
AST: 39 U/L — AB (ref 0–37)
Anion gap: 5 (ref 5–15)
BUN: 13 mg/dL (ref 6–23)
CHLORIDE: 105 meq/L (ref 96–112)
CO2: 25 mmol/L (ref 19–32)
CREATININE: 0.55 mg/dL (ref 0.50–1.10)
Calcium: 8.3 mg/dL — ABNORMAL LOW (ref 8.4–10.5)
GFR calc Af Amer: >90 mL/min (ref >90)
GFR calc non Af Amer: >90 mL/min (ref >90)
Glucose, Bld: 85 mg/dL (ref 70–99)
Potassium: 4 mmol/L (ref 3.5–5.1)
Sodium: 135 mmol/L (ref 135–145)
TOTAL PROTEIN: 7 g/dL (ref 6.0–8.3)
Total Bilirubin: 0.7 mg/dL (ref 0.3–1.2)

## 2014-01-06 LAB — CK: Total CK: 119 U/L (ref 7–177)

## 2014-01-06 LAB — CBC WITH DIFFERENTIAL/PLATELET
BASOS ABS: 0 10*3/uL (ref 0.0–0.1)
BASOS PCT: 0 % (ref 0–1)
EOS ABS: 0.2 10*3/uL (ref 0.0–0.7)
EOS PCT: 2 % (ref 0–5)
HCT: 38.2 % (ref 36.0–46.0)
Hemoglobin: 12.5 g/dL (ref 12.0–15.0)
LYMPHS PCT: 28 % (ref 12–46)
Lymphs Abs: 2.5 10*3/uL (ref 0.7–4.0)
MCH: 29.6 pg (ref 26.0–34.0)
MCHC: 32.7 g/dL (ref 30.0–36.0)
MCV: 90.3 fL (ref 78.0–100.0)
Monocytes Absolute: 0.7 10*3/uL (ref 0.1–1.0)
Monocytes Relative: 8 % (ref 3–12)
Neutro Abs: 5.6 10*3/uL (ref 1.7–7.7)
Neutrophils Relative %: 62 % (ref 43–77)
PLATELETS: 290 10*3/uL (ref 150–400)
RBC: 4.23 MIL/uL (ref 3.87–5.11)
RDW: 13.1 % (ref 11.5–15.5)
WBC: 9 10*3/uL (ref 4.0–10.5)

## 2014-01-06 LAB — RAPID URINE DRUG SCREEN, HOSP PERFORMED
AMPHETAMINES: POSITIVE — AB
Barbiturates: NOT DETECTED
Benzodiazepines: POSITIVE — AB
Cocaine: NOT DETECTED
OPIATES: POSITIVE — AB
Tetrahydrocannabinol: NOT DETECTED

## 2014-01-06 LAB — ACETAMINOPHEN LEVEL: Acetaminophen (Tylenol), Serum: <10 ug/mL — ABNORMAL LOW (ref 10–30)

## 2014-01-06 LAB — ETHANOL

## 2014-01-06 LAB — TROPONIN I: Troponin I: <0.03 ng/mL (ref <0.031)

## 2014-01-06 LAB — SALICYLATE LEVEL: Salicylate Lvl: <4 mg/dL (ref 2.8–20.0)

## 2014-01-06 MED ORDER — HYDROXYZINE HCL 25 MG PO TABS: 25.0000 mg | ORAL_TABLET | Freq: Four times a day (QID) | ORAL | Status: DC | PRN

## 2014-01-06 MED ORDER — CLONIDINE HCL 0.1 MG PO TABS: 0.1000 mg | ORAL_TABLET | Freq: Every day | ORAL | Status: DC

## 2014-01-06 MED ORDER — LOPERAMIDE HCL 2 MG PO CAPS: 2.0000 mg | ORAL_CAPSULE | ORAL | Status: DC | PRN

## 2014-01-06 MED ORDER — NALOXONE HCL 0.4 MG/ML IJ SOLN
0.4000 mg | Freq: Once | INTRAMUSCULAR | Status: AC
  Administered 2014-01-06: 0.4 mg via INTRAVENOUS
  Filled 2014-01-06: qty 1

## 2014-01-06 MED ORDER — METHOCARBAMOL 500 MG PO TABS: 500.0000 mg | ORAL_TABLET | Freq: Three times a day (TID) | ORAL | Status: DC | PRN

## 2014-01-06 MED ORDER — ONDANSETRON HCL 4 MG/2ML IJ SOLN
4.0000 mg | Freq: Once | INTRAMUSCULAR | Status: AC
  Administered 2014-01-06: 4 mg via INTRAVENOUS
  Filled 2014-01-06: qty 2

## 2014-01-06 MED ORDER — CLONIDINE HCL 0.1 MG PO TABS: 0.1000 mg | ORAL_TABLET | ORAL | Status: DC

## 2014-01-06 MED ORDER — CLONIDINE HCL 0.1 MG PO TABS
0.1000 mg | ORAL_TABLET | Freq: Four times a day (QID) | ORAL | Status: DC
  Administered 2014-01-06: 0.1 mg via ORAL
  Filled 2014-01-06: qty 1

## 2014-01-06 MED ORDER — SODIUM CHLORIDE 0.9 % IV BOLUS (SEPSIS)
1000.0000 mL | Freq: Once | INTRAVENOUS | Status: AC
  Administered 2014-01-06: 1000 mL via INTRAVENOUS

## 2014-01-06 MED ORDER — DICYCLOMINE HCL 20 MG PO TABS: 20.0000 mg | ORAL_TABLET | Freq: Four times a day (QID) | ORAL | Status: DC | PRN

## 2014-01-06 MED ORDER — NAPROXEN 500 MG PO TABS: 500.0000 mg | ORAL_TABLET | Freq: Two times a day (BID) | ORAL | Status: DC | PRN

## 2014-01-06 MED ORDER — IBUPROFEN 800 MG PO TABS
800.0000 mg | ORAL_TABLET | Freq: Once | ORAL | Status: AC
  Administered 2014-01-06: 800 mg via ORAL
  Filled 2014-01-06: qty 1

## 2014-01-06 MED ORDER — NALOXONE HCL 1 MG/ML IJ SOLN
1.0000 mg | Freq: Once | INTRAMUSCULAR | Status: AC
  Administered 2014-01-06: 1 mg via INTRAVENOUS
  Filled 2014-01-06: qty 2

## 2014-01-06 MED ORDER — ONDANSETRON 4 MG PO TBDP: 4.0000 mg | ORAL_TABLET | Freq: Four times a day (QID) | ORAL | Status: DC | PRN

## 2014-01-06 NOTE — ED Notes (Signed)
Bed: [facility] Expected date:  Expected time:  Means of arrival:  Comments: fall 

## 2014-01-06 NOTE — BH Assessment (Addendum)
Tele Assessment Note   Tonya Figueroa is an 29 y.o. female presenting to [facility] after an accidental overdose on heroin. Pt stated "I overdose on heroin". Pt reported that she had surgery on her hand in August and was given pain medication. Pt reported that she continued to get prescriptions for her pain medication and when she would run out she would get pain medications from her friends. Pt also reported that she started using heroin when she was unable to get any pain medication. PT stated "I will never touch anything again". Pt denies SI,HI and AVH at this time. Pt did not report any previous suicide attempts or psychiatric hospitalizations. Pt reported that she completed an inpatient program for substance abuse and was able to maintain her sobriety for 3  years. Pt is endorsing some depressive symptoms and shared that she is currently prescribed medication for ADHD and depression. Pt did not report any physical, sexual or emotional abuse at this time.  Pt is alert and oriented x3. Pt is calm and cooperative at this time. PT maintained good eye contact and her speech is normal. Pt mood is euthymic; affect is congruent with mood. Thought process is coherent and relevant. Pt does not meet inpatient criteria at this time. It is recommended that pt be provided with outpatient substance abuse resources.   Axis I: Opioid use disorder, Moderate   Past Medical History:  Past Medical History  Diagnosis Date  . Hypothyroid   . DVT (deep venous thrombosis)   . Pulmonary abscess   . Atrial septal defect   . MRSA carrier   . Depression   . ADHD (attention deficit hyperactivity disorder)     Past Surgical History  Procedure Laterality Date  . Nasal sinus surgery      Family History: No family history on file.  Social History:  reports that she has been smoking Cigarettes.  She has been smoking about 0.00 packs per day. She does not have any smokeless tobacco history on file. She reports that she  drinks alcohol. She reports that she uses illicit drugs (Marijuana and IV).  Additional Social History:  Alcohol / Drug Use Pain Medications: Pt reported that she has abused Roxycodone.  Prescriptions: Roxycodone  Over the Counter: Pt denies  History of alcohol / drug use?: Yes Longest period of sobriety (when/how long): 3 1/2 years  Negative Consequences of Use: Personal relationships, Work / Programmer, multimedia, Surveyor, quantity Withdrawal Symptoms: Patient aware of relationship between substance abuse and physical/medical complications, Nausea / Vomiting Substance #1 Name of Substance 1: Opioids  1 - Age of First Use: 22 1 - Amount (size/oz): 10 pills of Roxycodone or 2 $10 bags of heroin 1 - Frequency: daily  1 - Duration: 4 months  1 - Last Use / Amount: 01-06-14  CIWA: CIWA-Ar BP: (!) 98/49 mmHg Pulse Rate: 69 COWS:    PATIENT STRENGTHS: (choose at least two) Average or above average intelligence Motivation for treatment/growth  Allergies:  Allergies  Allergen Reactions  . Guaifenesin     REACTION: rash  . Hydrocodone Nausea And Vomiting  . Morphine And Related Nausea And Vomiting    Home Medications:  (Not in a hospital admission)  OB/GYN Status:  Patient's last menstrual period was 11/07/2013.  General Assessment Data Location of Assessment: [facility] ED Is this a Tele or Face-to-Face Assessment?: Face-to-Face Is this an Initial Assessment or a Re-assessment for this encounter?: Initial Assessment Living Arrangements: Spouse/significant other Can pt return to current living arrangement?: Yes  Admission Status: Voluntary Is patient capable of signing voluntary admission?: Yes Transfer from: Home Referral Source: Self/Family/Friend     [facility] Crisis Care Plan Living Arrangements: Spouse/significant other Name of Psychiatrist: Pt unable to provide the name of her provider at this time.  [facility] ) Name of Therapist: No provider reported at this time.  Education Status Is patient  currently in school?: No  Risk to self with the past 6 months Suicidal Ideation: No Suicidal Intent: No Is patient at risk for suicide?: No Suicidal Plan?: No Access to Means: No What has been your use of drugs/alcohol within the last 12 months?: Daily drug use reported.  Previous Attempts/Gestures: No How many times?: 0 Other Self Harm Risks: Substance abuse.  Triggers for Past Attempts: None known Intentional Self Injurious Behavior: None Family Suicide History: No Recent stressful life event(s): Other (Comment) (Job stress) Persecutory voices/beliefs?: No Depression: Yes Depression Symptoms: Despondent, Tearfulness, Fatigue, Guilt, Feeling worthless/self pity, Loss of interest in usual pleasures Substance abuse history and/or treatment for substance abuse?: Yes Suicide prevention information given to non-admitted patients: Not applicable  Risk to Others within the past 6 months Homicidal Ideation: No Thoughts of Harm to Others: No Current Homicidal Intent: No Current Homicidal Plan: No Access to Homicidal Means: No Identified Victim: NA History of harm to others?: No Assessment of Violence: On admission Violent Behavior Description: No violent behaviors observed. Pt is calm and cooperative at this time.  Does patient have access to weapons?: No Criminal Charges Pending?: No Does patient have a court date: No  Psychosis Hallucinations: None noted Delusions: None noted  Mental Status Report Appear/Hygiene: Unremarkable Eye Contact: Good Motor Activity: Freedom of movement Speech: Logical/coherent Level of Consciousness: Alert Mood: Euthymic Affect: Appropriate to circumstance Anxiety Level: Minimal Thought Processes: Coherent, Relevant Judgement: Unimpaired Orientation: Place, Time, Situation, Person Obsessive Compulsive Thoughts/Behaviors: None  Cognitive Functioning Concentration: Normal Memory: Recent Intact, Remote Intact IQ: Average Insight:  Fair Impulse Control: Fair Appetite: Good Weight Loss: 0 Weight Gain: 5 Sleep: No Change Total Hours of Sleep: 8 Vegetative Symptoms: Staying in bed  ADLScreening [facility] Assessment Services) Patient's cognitive ability adequate to safely complete daily activities?: Yes Patient able to express need for assistance with ADLs?: Yes Independently performs ADLs?: Yes (appropriate for developmental age)  Prior Inpatient Therapy Prior Inpatient Therapy: Yes Prior Therapy Dates: "12-10-05" Prior Therapy Facilty/Provider(s): [facility] Reason for Treatment: Substance abuse  Prior Outpatient Therapy Prior Outpatient Therapy: Yes Prior Therapy Dates: 2014 Prior Therapy Facilty/Provider(s):  [facility] ) Reason for Treatment: ADHD/Depression   ADL Screening (condition at time of admission) Patient's cognitive ability adequate to safely complete daily activities?: Yes Patient able to express need for assistance with ADLs?: Yes Independently performs ADLs?: Yes (appropriate for developmental age)       Abuse/Neglect Assessment (Assessment to be complete while patient is alone) Physical Abuse: Denies Verbal Abuse: Denies Sexual Abuse: Denies Exploitation of patient/patient's resources: Denies Self-Neglect: Denies     Merchant navy officer (For Healthcare) Does patient have an advance directive?: No Would patient like information on creating an advanced directive?: No - patient declined information    Additional Information 1:1 In Past 12 Months?: No CIRT Risk: No Elopement Risk: No     Disposition: Outpatient treatment for substance abuse.  Disposition Initial Assessment Completed for this Encounter: Yes  Armilda Vanderlinden S 01/06/2014 8:08 PM

## 2014-01-06 NOTE — ED Notes (Signed)
Pt was found to have resp of 6-8/min. Greta Doom, PA notified

## 2014-01-06 NOTE — ED Notes (Signed)
Pt speaking with mental health specialist.

## 2014-01-06 NOTE — Discharge Instructions (Signed)
Please follow up with the [facility] clinic on Wednesday for outpatient treatment of your drugs use.     Emergency Department Resource Guide 1) Find a Doctor and Pay Out of Pocket Although you won't have to find out who is covered by your insurance plan, it is a good idea to ask around and get recommendations. You will then need to call the office and see if the doctor you have chosen will accept you as a new patient and what types of options they offer for patients who are self-pay. Some doctors offer discounts or will set up payment plans for their patients who do not have insurance, but you will need to ask so you aren't surprised when you get to your appointment.  2) Contact Your Local Health Department Not all health departments have doctors that can see patients for sick visits, but many do, so it is worth a call to see if yours does. If you don't know where your local health department is, you can check in your phone book. The CDC also has a tool to help you locate your state's health department, and many state websites also have listings of all of their local health departments.  3) Find a Walk-in Clinic If your illness is not likely to be very severe or complicated, you may want to try a walk in clinic. These are popping up all over the country in pharmacies, drugstores, and shopping centers. They're usually staffed by nurse practitioners or physician assistants that have been trained to treat common illnesses and complaints. They're usually fairly quick and inexpensive. However, if you have serious medical issues or chronic medical problems, these are probably not your best option.  No Primary Care Doctor: - Call Health Connect at  [phone] - they can help you locate a primary care doctor that  accepts your insurance, provides certain services, etc. - Physician Referral Service- [phone]  Chronic Pain Problems: Organization         Address  Phone   Notes  [facility]   [phone] Patients need to be referred by their primary care doctor.   Medication Assistance: Organization         Address  Phone   Notes  [facility] Medication [facility] [address] [place] [place], [place] [address] [phone] --Must be a resident of [facility] -- Must have NO insurance coverage whatsoever (no Medicaid/ Medicare, etc.) -- The pt. MUST have a primary care doctor that directs their care regularly and follows them in the community   [facility]  [phone]   [employer]  [phone]    Agencies that provide inexpensive medical care: Organization         Address  Phone   Notes  [facility]  [phone]   [facility]    [phone]   [facility] [address] [place], [place] [address] [phone]   [facility] [address] [place] [place] [phone]   Planned Parenthood    [phone]   [facility]    [phone]   [facility]  [address], [place] Phone:  [phone], Fax:  [phone] Hours of Operation:  9 am - 6 pm, M-F.  Also accepts Medicaid/Medicare and self-pay.  [facility]  [address], [place] Phone: [phone], Fax: [phone]. Hours  of Operation:  8:30 am - 5:30 pm, M-F.  Also accepts Medicaid and self-pay.  [facility] [address], [place] Phone: [phone]   [place], [place], [place] [phone], Ext. [phone] Mondays & Thursdays: 7-9 AM.  First 15 patients are seen on a first come, first serve basis.    [place] Providers:  Organization         Address  Phone   Notes  [facility] [address],  [phone] Also accepts self-pay patients.  [facility] [address], [place]  [phone]   [place], [address], [place] [phone]   [facility] [address], [place] [phone]   Lucianne Lei [address], [place]   [phone] Only accepts [place] Access [place] patients after they have their name applied to their card.   Self-Pay (no insurance) in [facility]:  Organization         Address  Phone   Notes  Sickle Cell Patients, [facility] [place] [phone]   [facility] [place] [phone]   [facility] [place]  [place], [place], [place] [phone]   [facility]/Dr. Osei-Bonsu  [address], [place] or [address], [place] [phone] Phone number for both [place] and [place] locations is the same.  [facility] [address], [place] [phone]   [facility] [address], [place] or [address] [phone]   [facility] [address] [place] [phone], phone; [phone], fax Sees patients 1st and 3rd Saturday of every month.  Must not qualify for public or private insurance (i.e. Medicaid, Medicare, [place] Health Choice, Veterans' Benefits)  Household income should be no more than 200% of the poverty level The clinic cannot treat you if you are pregnant or think you are pregnant  Sexually transmitted diseases are not treated at the clinic.    Dental Care: Organization         Address  Phone  Notes  [facility] of [facility] [phone] Accepts children up to age 47 who are enrolled in [place] or [place]; pregnant women with a Medicaid card; and children who have applied for Medicaid or [place] Health  Choice, but were declined, whose parents can pay a reduced fee at time of service.  [facility] Department of [facility]  [address], [place] [phone] Accepts children up to age 37 who are enrolled in [place] or [place]; pregnant women with a Medicaid card; and children who have applied for Medicaid or [place] Health Choice, but were declined, whose parents can pay a reduced fee at time of service.  [place] [facility] Access PRO[facility]  [place] [phone] Patients are seen by appointment only. Walk-ins are not accepted. [place] will see patients 43 years of age and older. Monday - Tuesday (8am-5pm) Most Wednesdays (8:30-5pm) $30 per visit, cash only  [facility]  [address], [place] [phone] Patients are  seen by appointment only. Walk-ins are not accepted. [place] will see patients 50 years of age and older. One Wednesday Evening (Monthly: Volunteer Based).  $30 per visit, cash only  [place]  [phone] for adults; Children under age 61, call Graduate Pediatric Dentistry at [phone]. Children aged 11-14, please call [phone] to request a pediatric application.  Dental services are provided in all areas of dental care including fillings, crowns and bridges, complete and partial dentures, implants, gum treatment, root canals, and extractions. Preventive care is also provided. Treatment is provided to both adults and children. Patients are selected via a lottery and there is often a waiting list.   [facility] [address] [place]  [phone] [email or web address]   [facility] [address] [place], [place] [phone], Ext. [phone] Second and Fourth Thursday of each month, opens at 6:30 AM; Clinic ends at 9 AM.  Patients are seen on a first-come first-served basis, and a limited number are seen during each  clinic.   [facility]  [address] Hillard Danker [place], [place] [phone]   Eligibility Requirements You must have lived in [place], [place], or [place] counties for at least the last three months.   You cannot be eligible for state or federal sponsored [employer], including [employer], [place], or [employer].   You generally cannot be eligible for healthcare insurance through your employer.    How to apply: Eligibility screenings are held every Tuesday and Wednesday afternoon from 1:00 pm until 4:00 pm. You do not need an appointment for the interview!  [facility] [address] [place], [place]   [place]  [place] Department  [place]  [phone]    Behavioral Health Resources in the Community: Intensive Outpatient Programs Organization         Address  Phone  Notes  [place] Keeler. [address] [place], [place] [phone]   [facility] [address] [place], [place]   ADS: [facility] [address], [place], [place]   [place] [address]  [place], [place] or [phone]   Substance Abuse Resources Organization         Address  Phone  Notes  Alcohol and Drug Services  [phone]   [place]  [phone]   The [place]   Chinita Pester  [phone]   Residential & Outpatient Substance Abuse Program  [phone]   Psychological Services Organization         Address  Phone  Notes  [facility]  [place]  [phone]   [place] [address], [place] or [phone]    Mobile Crisis Teams Organization         Address  Phone  Notes  [facility], Mobile  Crisis Care Unit  [phone]   [facility]  [address] [place], [place]   [place] [address], [place] [phone]    Self-Help/Support Groups Organization         Address  Phone             Notes  [place]. of [place] - variety of support groups  [place] Call for  more information  Narcotics Anonymous (NA), [facility] [address], [employer] [place]  2 meetings at this location   Residential Facilities manager         Address  Phone  Notes  [facility] [place],    [place]  [phone]   [facility]  [address] [place] [phone], [place], [place]   [place], [place] [phone] Admissions: 8am-3pm M-F  Incentives Substance [place] [address],    [place], [place] [phone]   [facility] [address] [place], [place], [place]   The [facility] [address],  [place], [place]   [facility] - Intensive Outpatient [address], [place], [place]   [facility] ([place].) [place].,  [place], [place] [phone] or [phone]   [facility] (RTS) [address] [place], [place] Accepts Medicaid  [facility] [place] [address]  [place] [phone] Substance Abuse/Addiction Treatment   [facility] Organization         Address  Phone  Notes  [facility]  [phone]   Domenic Schwab, PhD [address] Arlis Porta [place], [place]   [phone] or [phone]   [place], [place] [phone]   [facility] [address], [place], [place] [phone] Insurance/Medicaid/sponsorship through [facility] and Families [address]  [place]                                    [place], [place] [phone] [place] [address] [place], [place] [phone]    Dr. Adele Schilder  [phone]   [facility] Dept. 1) [address] [place] 2) [place] 3)  [place] [address], [place] [phone]  [phone]   [place] [phone] or [phone] (After Hours)

## 2014-01-06 NOTE — ED Notes (Signed)
Awake. Verbally responsive. A/O x4. Resp even and unlabored. No audible adventitious breath sounds noted. ABC's intact. Pt ambulated to bathroom. Gait steady. Friend at bedside and given pt something to eat. Pt denies n/v.

## 2014-01-06 NOTE — ED Notes (Signed)
Awake. Verbally responsive. A/O x4. Resp even and unlabored. No audible adventitious breath sounds noted. ABC's intact. Pt requesting meal to eat. Denies nausea. Tolerated cracker and drink.  Friend at bedside.

## 2014-01-06 NOTE — ED Notes (Signed)
Pt boyfriend at bedside.

## 2014-01-06 NOTE — ED Notes (Signed)
Pt has black book bag and gold colored purse under nurses station in front of [place] [address]

## 2014-01-06 NOTE — ED Provider Notes (Signed)
CSN: [id number]     Arrival date & time 01/06/14  1558 History   None    Chief Complaint  Patient presents with  . heroin overdose      (Consider location/radiation/quality/duration/timing/severity/associated sxs/prior Treatment) HPI   Pt with hx of depression, ADHD who was brought here by boyfriend for possible unintentional overdose of heroin.  Pt sts she has been injecting heroin along with taking oxycodone for the past few days and she believes she may have using too much today.  Last Heroin use was 30 min ago.  Currently pt report feeling dizzy, nauseated, having difficulty breathing and feeling sleepy.  She's unable to quantify the amount of heroin or oxycodone use.  She denies SI/HI/hallucination.  Denies alcohol use.  She report having some chest discomfort, specifically under her breasts.    Past Medical History  Diagnosis Date  . Hypothyroid   . DVT (deep venous thrombosis)   . Pulmonary abscess   . Atrial septal defect   . MRSA carrier   . Depression   . ADHD (attention deficit hyperactivity disorder)    Past Surgical History  Procedure Laterality Date  . Nasal sinus surgery     No family history on file. History  Substance Use Topics  . Smoking status: Current Every Day Smoker    Types: Cigarettes  . Smokeless tobacco: Not on file     Comment: 5 cigs/day  . Alcohol Use: Yes     Comment: occasional   OB History    No data available     Review of Systems  Constitutional: Negative for fever.  Respiratory: Positive for shortness of breath.   Cardiovascular: Positive for chest pain.  Psychiatric/Behavioral: Negative for suicidal ideas.  All other systems reviewed and are negative.     Allergies  Guaifenesin; Hydrocodone; and Morphine and related  Home Medications   Prior to Admission medications   Medication Sig Start Date End Date Taking? Authorizing Provider  buPROPion (WELLBUTRIN SR) 200 MG 12 hr tablet Take 200 mg by mouth 2 (two) times daily.       Historical Provider, MD  levothyroxine (SYNTHROID, LEVOTHROID) 25 MCG tablet Take 1 tablet (25 mcg total) by mouth daily. 03/20/13   Quentin Angst, MD  predniSONE (DELTASONE) 20 MG tablet Take 2 tablets (40 mg total) by mouth once. 09/24/13   Raeford Razor, MD  traMADol (ULTRAM) 50 MG tablet Take 1 tablet (50 mg total) by mouth every 6 (six) hours as needed. 09/24/13   Raeford Razor, MD   BP 118/78 mmHg  Pulse 83  Temp(Src)   Resp 15  SpO2 96%  LMP 11/07/2013 Physical Exam  Constitutional: She appears well-developed and well-nourished. No distress (pt appears drowsy, slurring of speech, lethargic.).  HENT:  Head: Atraumatic.  Mouth/Throat: Oropharynx is clear and moist.  Eyes: Conjunctivae are normal.  Neck: Normal range of motion. Neck supple.  Cardiovascular: Normal rate and regular rhythm.   Pulmonary/Chest: Effort normal and breath sounds normal.  Abdominal: Soft. There is no tenderness.  Neurological: She is alert. She has normal strength. No cranial nerve deficit or sensory deficit. GCS eye subscore is 4. GCS verbal subscore is 5. GCS motor subscore is 6.  Skin: No rash noted.  Psychiatric: Her affect is blunt. Her speech is delayed and slurred. She is slowed. Thought content is not paranoid. Cognition and memory are impaired. She expresses no homicidal and no suicidal ideation.  Nursing note and vitals reviewed.   ED Course  Procedures (including  critical care time)  Pt with unintentional OD on Heroin.  No SI/HI/hallucination.  Report having SOB and CP.  Work up initiated.  IVF given.  narcain given with increase agitation and alertness. Will monitor closely.    4:45 PM Pt currently report her cp has completely resolved.    5:40 PM Pt's respiratory rate drops down to 8, she appears lethargic.  Will give 1mg  narcain and monitor.   7:27 PM Pt currently mentating appropriately.  She report she initially received pain medication from a surgical procedure and since then  she is addicted to pain meds.  When she ran out, she report having side effects from withdrawal.  Someone recommend heroin as a replacement and that's why she was hooked on it.  She requesting help with detox.  Denies SI/HI.  I have consulted TTS to evaluate pt.    8:30 PM TTS has evaluate patient and scheduled outpatient treatment for her drug abuse. Patient [facility] for [facility] care. Resource provided. At this time patient is stable for discharge. Return precautions discussed.   CRITICAL CARE Performed by: Saturday Total critical care time: 30 min Critical care time was exclusive of separately billable procedures and treating other patients. Critical care was necessary to treat or prevent imminent or life-threatening deterioration. Critical care was time spent personally by me on the following activities: development of treatment plan with patient and/or surrogate as well as nursing, discussions with consultants, evaluation of patient's response to treatment, examination of patient, obtaining history from patient or surrogate, ordering and performing treatments and interventions, ordering and review of laboratory studies, ordering and review of radiographic studies, pulse oximetry and re-evaluation of patient's condition.   Labs Review Labs Reviewed  URINE RAPID DRUG SCREEN (HOSP PERFORMED) - Abnormal; Notable for the following:    Opiates POSITIVE (*)    Benzodiazepines POSITIVE (*)    Amphetamines POSITIVE (*)    All other components within normal limits  ACETAMINOPHEN LEVEL - Abnormal; Notable for the following:    Acetaminophen (Tylenol), Serum <10.0 (*)    All other components within normal limits  COMPREHENSIVE METABOLIC PANEL - Abnormal; Notable for the following:    Calcium 8.3 (*)    AST 39 (*)    ALT 47 (*)    All other components within normal limits  CBC WITH DIFFERENTIAL  TROPONIN I  ETHANOL  CK  SALICYLATE LEVEL  POC URINE PREG, ED     Imaging Review No results found.   EKG Interpretation   Date/Time:  Sunday January 06 2014 16:02:57 EST Ventricular Rate:  80 PR Interval:  143 QRS Duration: 89 QT Interval:  405 QTC Calculation: 467 R Axis:   64 Text Interpretation:  Sinus rhythm no acute st changes Artifact Confirmed  by 09-27-1971 [phone]) on 01/06/2014 4:08:48 PM      MDM   Final diagnoses:  Heroin overdose, accidental or unintentional, initial encounter    BP 102/60 mmHg  Pulse 66  Temp(Src)   Resp 18  SpO2 98%  LMP 11/07/2013  I have reviewed nursing notes and vital signs. I reviewed available ER/hospitalization records thought the EMR    13/04/2013, Fayrene Helper 01/06/14 2107  2108, MD 01/06/14 2205

## 2014-01-06 NOTE — ED Notes (Signed)
Lab called stating that her chemistry needs to be re-collected so we need to re-send her dark green.

## 2014-01-06 NOTE — ED Notes (Signed)
Pt states that she used heroin about 30 mins ago and states that she is dizzy, nauseated and having trouble breathing.  Pt drinking water in triage.

## 2014-01-06 NOTE — BH Assessment (Signed)
Assessment completed. Consulted Verne Spurr, PA-C who reported that pt does not meet inpatient criteria and should be provided with outpatient resources for substance abuse treatment. Fayrene Helper, PA-C has been informed of the recommendation.

## 2014-01-06 NOTE — ED Notes (Signed)
Awake. Verbally responsive. A/O x4. Resp even and unlabored. No audible adventitious breath sounds noted. ABC's intact. Friend at bedside.
